# Patient Record
Sex: Female | Born: 1940 | Race: White | Hispanic: No | Marital: Married | Smoking: Never smoker
Health system: Southern US, Community
[De-identification: ages and names within clinical notes are randomized; demographics above are authoritative.]

## PROBLEM LIST (undated history)

## (undated) DIAGNOSIS — B741 Filariasis due to Brugia malayi: Secondary | ICD-10-CM

## (undated) DIAGNOSIS — I10 Essential (primary) hypertension: Secondary | ICD-10-CM

## (undated) DIAGNOSIS — C801 Malignant (primary) neoplasm, unspecified: Secondary | ICD-10-CM

## (undated) DIAGNOSIS — E785 Hyperlipidemia, unspecified: Secondary | ICD-10-CM

## (undated) DIAGNOSIS — K219 Gastro-esophageal reflux disease without esophagitis: Secondary | ICD-10-CM

## (undated) DIAGNOSIS — M1990 Unspecified osteoarthritis, unspecified site: Secondary | ICD-10-CM

## (undated) HISTORY — DX: Gastro-esophageal reflux disease without esophagitis: K21.9

## (undated) HISTORY — DX: Malignant (primary) neoplasm, unspecified: C80.1

## (undated) HISTORY — DX: Essential (primary) hypertension: I10

## (undated) HISTORY — DX: Hyperlipidemia, unspecified: E78.5

## (undated) HISTORY — PX: CHOLECYSTECTOMY: SHX55

## (undated) HISTORY — DX: Filariasis due to Brugia malayi: B74.1

## (undated) HISTORY — DX: Unspecified osteoarthritis, unspecified site: M19.90

---

## 1988-08-14 HISTORY — PX: ABDOMINAL HYSTERECTOMY: SHX81

## 1998-08-14 HISTORY — PX: OTHER SURGICAL HISTORY: SHX169

## 1999-08-17 ENCOUNTER — Inpatient Hospital Stay (HOSPITAL_COMMUNITY): Admission: RE | Admit: 1999-08-17 | Discharge: 1999-08-19 | Payer: Self-pay | Admitting: Obstetrics and Gynecology

## 2000-05-08 ENCOUNTER — Encounter: Admission: RE | Admit: 2000-05-08 | Discharge: 2000-05-08 | Payer: Self-pay | Admitting: Obstetrics and Gynecology

## 2000-05-08 ENCOUNTER — Encounter: Payer: Self-pay | Admitting: Obstetrics and Gynecology

## 2000-05-15 ENCOUNTER — Encounter: Admission: RE | Admit: 2000-05-15 | Discharge: 2000-05-15 | Payer: Self-pay | Admitting: Obstetrics and Gynecology

## 2000-05-15 ENCOUNTER — Encounter: Payer: Self-pay | Admitting: Obstetrics and Gynecology

## 2000-09-24 ENCOUNTER — Other Ambulatory Visit: Admission: RE | Admit: 2000-09-24 | Discharge: 2000-09-24 | Payer: Self-pay | Admitting: Obstetrics and Gynecology

## 2000-10-31 ENCOUNTER — Ambulatory Visit (HOSPITAL_COMMUNITY): Admission: RE | Admit: 2000-10-31 | Discharge: 2000-10-31 | Payer: Self-pay | Admitting: Gastroenterology

## 2001-05-21 ENCOUNTER — Encounter: Payer: Self-pay | Admitting: Obstetrics and Gynecology

## 2001-05-21 ENCOUNTER — Encounter: Admission: RE | Admit: 2001-05-21 | Discharge: 2001-05-21 | Payer: Self-pay | Admitting: Obstetrics and Gynecology

## 2001-11-15 ENCOUNTER — Other Ambulatory Visit: Admission: RE | Admit: 2001-11-15 | Discharge: 2001-11-15 | Payer: Self-pay | Admitting: Obstetrics and Gynecology

## 2002-02-03 ENCOUNTER — Encounter: Payer: Self-pay | Admitting: Family Medicine

## 2002-02-03 ENCOUNTER — Encounter: Admission: RE | Admit: 2002-02-03 | Discharge: 2002-02-03 | Payer: Self-pay | Admitting: Family Medicine

## 2002-06-26 ENCOUNTER — Encounter: Payer: Self-pay | Admitting: Obstetrics and Gynecology

## 2002-06-26 ENCOUNTER — Encounter: Admission: RE | Admit: 2002-06-26 | Discharge: 2002-06-26 | Payer: Self-pay | Admitting: Obstetrics and Gynecology

## 2002-07-22 ENCOUNTER — Ambulatory Visit (HOSPITAL_COMMUNITY): Admission: RE | Admit: 2002-07-22 | Discharge: 2002-07-22 | Payer: Self-pay | Admitting: Gastroenterology

## 2002-11-27 ENCOUNTER — Other Ambulatory Visit: Admission: RE | Admit: 2002-11-27 | Discharge: 2002-11-27 | Payer: Self-pay | Admitting: Obstetrics and Gynecology

## 2003-09-09 ENCOUNTER — Encounter: Admission: RE | Admit: 2003-09-09 | Discharge: 2003-09-09 | Payer: Self-pay | Admitting: Obstetrics and Gynecology

## 2004-12-16 ENCOUNTER — Encounter: Admission: RE | Admit: 2004-12-16 | Discharge: 2004-12-16 | Payer: Self-pay | Admitting: Obstetrics and Gynecology

## 2005-12-19 ENCOUNTER — Encounter: Admission: RE | Admit: 2005-12-19 | Discharge: 2005-12-19 | Payer: Self-pay | Admitting: Obstetrics and Gynecology

## 2006-03-21 ENCOUNTER — Encounter: Admission: RE | Admit: 2006-03-21 | Discharge: 2006-03-21 | Payer: Self-pay | Admitting: Gastroenterology

## 2007-08-15 DIAGNOSIS — C801 Malignant (primary) neoplasm, unspecified: Secondary | ICD-10-CM

## 2007-08-15 HISTORY — DX: Malignant (primary) neoplasm, unspecified: C80.1

## 2007-08-15 HISTORY — PX: BREAST SURGERY: SHX581

## 2007-08-15 HISTORY — PX: OTHER SURGICAL HISTORY: SHX169

## 2008-01-30 ENCOUNTER — Encounter: Admission: RE | Admit: 2008-01-30 | Discharge: 2008-01-30 | Payer: Self-pay | Admitting: Obstetrics and Gynecology

## 2008-02-07 ENCOUNTER — Encounter: Admission: RE | Admit: 2008-02-07 | Discharge: 2008-02-07 | Payer: Self-pay | Admitting: Obstetrics and Gynecology

## 2008-02-18 ENCOUNTER — Encounter: Admission: RE | Admit: 2008-02-18 | Discharge: 2008-02-18 | Payer: Self-pay | Admitting: Obstetrics and Gynecology

## 2008-03-17 ENCOUNTER — Encounter: Admission: RE | Admit: 2008-03-17 | Discharge: 2008-03-17 | Payer: Self-pay | Admitting: General Surgery

## 2008-03-19 ENCOUNTER — Encounter: Admission: RE | Admit: 2008-03-19 | Discharge: 2008-03-19 | Payer: Self-pay | Admitting: General Surgery

## 2008-03-20 ENCOUNTER — Ambulatory Visit (HOSPITAL_BASED_OUTPATIENT_CLINIC_OR_DEPARTMENT_OTHER): Admission: RE | Admit: 2008-03-20 | Discharge: 2008-03-20 | Payer: Self-pay | Admitting: General Surgery

## 2008-03-20 ENCOUNTER — Encounter: Admission: RE | Admit: 2008-03-20 | Discharge: 2008-03-20 | Payer: Self-pay | Admitting: General Surgery

## 2008-03-20 ENCOUNTER — Encounter (INDEPENDENT_AMBULATORY_CARE_PROVIDER_SITE_OTHER): Payer: Self-pay | Admitting: General Surgery

## 2008-03-24 ENCOUNTER — Ambulatory Visit: Payer: Self-pay | Admitting: Oncology

## 2008-04-06 ENCOUNTER — Ambulatory Visit: Admission: RE | Admit: 2008-04-06 | Discharge: 2008-04-28 | Payer: Self-pay | Admitting: Radiation Oncology

## 2008-04-15 LAB — CBC WITH DIFFERENTIAL/PLATELET
Basophils Absolute: 0 10*3/uL (ref 0.0–0.1)
EOS%: 1.8 % (ref 0.0–7.0)
Eosinophils Absolute: 0.1 10*3/uL (ref 0.0–0.5)
HCT: 39.8 % (ref 34.8–46.6)
HGB: 13.8 g/dL (ref 11.6–15.9)
LYMPH%: 28.2 % (ref 14.0–48.0)
MCH: 31.9 pg (ref 26.0–34.0)
MCV: 92 fL (ref 81.0–101.0)
MONO%: 11.2 % (ref 0.0–13.0)
NEUT#: 3.5 10*3/uL (ref 1.5–6.5)
NEUT%: 58.4 % (ref 39.6–76.8)
Platelets: 253 10*3/uL (ref 145–400)

## 2008-04-16 LAB — VITAMIN D 25 HYDROXY (VIT D DEFICIENCY, FRACTURES): Vit D, 25-Hydroxy: 28 ng/mL — ABNORMAL LOW (ref 30–89)

## 2008-04-16 LAB — COMPREHENSIVE METABOLIC PANEL
ALT: 21 U/L (ref 0–35)
AST: 22 U/L (ref 0–37)
Alkaline Phosphatase: 73 U/L (ref 39–117)
BUN: 13 mg/dL (ref 6–23)
Creatinine, Ser: 0.73 mg/dL (ref 0.40–1.20)
Total Bilirubin: 0.5 mg/dL (ref 0.3–1.2)

## 2008-04-21 ENCOUNTER — Ambulatory Visit (HOSPITAL_COMMUNITY): Admission: RE | Admit: 2008-04-21 | Discharge: 2008-04-21 | Payer: Self-pay | Admitting: Oncology

## 2008-04-22 ENCOUNTER — Ambulatory Visit: Payer: Self-pay | Admitting: Cardiovascular Disease

## 2008-04-22 ENCOUNTER — Ambulatory Visit: Admission: RE | Admit: 2008-04-22 | Discharge: 2008-04-22 | Payer: Self-pay | Admitting: Oncology

## 2008-04-22 ENCOUNTER — Encounter: Payer: Self-pay | Admitting: Oncology

## 2008-04-24 ENCOUNTER — Ambulatory Visit (HOSPITAL_BASED_OUTPATIENT_CLINIC_OR_DEPARTMENT_OTHER): Admission: RE | Admit: 2008-04-24 | Discharge: 2008-04-24 | Payer: Self-pay | Admitting: General Surgery

## 2008-05-07 LAB — CBC WITH DIFFERENTIAL/PLATELET
BASO%: 0.5 % (ref 0.0–2.0)
Basophils Absolute: 0 10*3/uL (ref 0.0–0.1)
Eosinophils Absolute: 0.1 10*3/uL (ref 0.0–0.5)
HCT: 42.1 % (ref 34.8–46.6)
HGB: 14.5 g/dL (ref 11.6–15.9)
MCHC: 34.5 g/dL (ref 32.0–36.0)
MONO#: 0.7 10*3/uL (ref 0.1–0.9)
NEUT#: 0.9 10*3/uL — ABNORMAL LOW (ref 1.5–6.5)
NEUT%: 32.6 % — ABNORMAL LOW (ref 39.6–76.8)
WBC: 2.7 10*3/uL — ABNORMAL LOW (ref 3.9–10.0)
lymph#: 1.1 10*3/uL (ref 0.9–3.3)

## 2008-05-13 ENCOUNTER — Ambulatory Visit: Payer: Self-pay | Admitting: Oncology

## 2008-05-15 LAB — CBC WITH DIFFERENTIAL/PLATELET
BASO%: 1.5 % (ref 0.0–2.0)
HCT: 37.2 % (ref 34.8–46.6)
LYMPH%: 18.1 % (ref 14.0–48.0)
MCH: 30.7 pg (ref 26.0–34.0)
MCHC: 34.5 g/dL (ref 32.0–36.0)
MCV: 89.1 fL (ref 81.0–101.0)
MONO#: 0.7 10*3/uL (ref 0.1–0.9)
MONO%: 9.8 % (ref 0.0–13.0)
NEUT%: 70.5 % (ref 39.6–76.8)
Platelets: 165 10*3/uL (ref 145–400)
RBC: 4.17 10*6/uL (ref 3.70–5.32)
WBC: 7.4 10*3/uL (ref 3.9–10.0)

## 2008-05-22 LAB — CBC WITH DIFFERENTIAL/PLATELET
BASO%: 0.3 % (ref 0.0–2.0)
HCT: 32.9 % — ABNORMAL LOW (ref 34.8–46.6)
MCHC: 35.7 g/dL (ref 32.0–36.0)
MONO#: 0.6 10*3/uL (ref 0.1–0.9)
NEUT%: 86.5 % — ABNORMAL HIGH (ref 39.6–76.8)
RDW: 13 % (ref 11.3–14.5)
WBC: 13.3 10*3/uL — ABNORMAL HIGH (ref 3.9–10.0)
lymph#: 1.2 10*3/uL (ref 0.9–3.3)

## 2008-05-22 LAB — COMPREHENSIVE METABOLIC PANEL
ALT: 53 U/L — ABNORMAL HIGH (ref 0–35)
Alkaline Phosphatase: 69 U/L (ref 39–117)
CO2: 23 mEq/L (ref 19–32)
Creatinine, Ser: 0.62 mg/dL (ref 0.40–1.20)
Sodium: 137 mEq/L (ref 135–145)
Total Bilirubin: 0.4 mg/dL (ref 0.3–1.2)
Total Protein: 6.6 g/dL (ref 6.0–8.3)

## 2008-05-29 LAB — CBC WITH DIFFERENTIAL/PLATELET
BASO%: 1.9 % (ref 0.0–2.0)
Basophils Absolute: 0.1 10e3/uL (ref 0.0–0.1)
EOS%: 2.2 % (ref 0.0–7.0)
Eosinophils Absolute: 0.2 10e3/uL (ref 0.0–0.5)
HCT: 35.4 % (ref 34.8–46.6)
HGB: 12.4 g/dL (ref 11.6–15.9)
LYMPH%: 21.7 % (ref 14.0–48.0)
MCH: 30.6 pg (ref 26.0–34.0)
MCHC: 34.9 g/dL (ref 32.0–36.0)
MCV: 87.7 fL (ref 81.0–101.0)
MONO#: 1.5 10e3/uL — ABNORMAL HIGH (ref 0.1–0.9)
MONO%: 21.5 % — ABNORMAL HIGH (ref 0.0–13.0)
NEUT#: 3.7 10e3/uL (ref 1.5–6.5)
NEUT%: 52.7 % (ref 39.6–76.8)
Platelets: 161 10e3/uL (ref 145–400)
RBC: 4.04 10e6/uL (ref 3.70–5.32)
RDW: 12.8 % (ref 11.3–14.5)
WBC: 7 10e3/uL (ref 3.9–10.0)
lymph#: 1.5 10e3/uL (ref 0.9–3.3)

## 2008-06-05 LAB — CBC WITH DIFFERENTIAL/PLATELET
Basophils Absolute: 0.1 10*3/uL (ref 0.0–0.1)
EOS%: 0.4 % (ref 0.0–7.0)
Eosinophils Absolute: 0 10*3/uL (ref 0.0–0.5)
HCT: 32 % — ABNORMAL LOW (ref 34.8–46.6)
HGB: 11.2 g/dL — ABNORMAL LOW (ref 11.6–15.9)
MCH: 31.4 pg (ref 26.0–34.0)
MCV: 89.3 fL (ref 81.0–101.0)
MONO%: 8.4 % (ref 0.0–13.0)
NEUT#: 4.9 10*3/uL (ref 1.5–6.5)
NEUT%: 73.9 % (ref 39.6–76.8)
Platelets: 150 10*3/uL (ref 145–400)

## 2008-06-12 LAB — COMPREHENSIVE METABOLIC PANEL
AST: 25 U/L (ref 0–37)
Alkaline Phosphatase: 79 U/L (ref 39–117)
BUN: 15 mg/dL (ref 6–23)
Glucose, Bld: 162 mg/dL — ABNORMAL HIGH (ref 70–99)
Potassium: 3.9 mEq/L (ref 3.5–5.3)
Total Bilirubin: 0.5 mg/dL (ref 0.3–1.2)

## 2008-06-12 LAB — CBC WITH DIFFERENTIAL/PLATELET
Basophils Absolute: 0 10*3/uL (ref 0.0–0.1)
EOS%: 0.2 % (ref 0.0–7.0)
Eosinophils Absolute: 0 10*3/uL (ref 0.0–0.5)
HGB: 10.7 g/dL — ABNORMAL LOW (ref 11.6–15.9)
LYMPH%: 10.3 % — ABNORMAL LOW (ref 14.0–48.0)
MCH: 32 pg (ref 26.0–34.0)
MCV: 90.9 fL (ref 81.0–101.0)
MONO%: 4.5 % (ref 0.0–13.0)
NEUT#: 8.8 10*3/uL — ABNORMAL HIGH (ref 1.5–6.5)
Platelets: 256 10*3/uL (ref 145–400)
RBC: 3.36 10*6/uL — ABNORMAL LOW (ref 3.70–5.32)
RDW: 15.6 % — ABNORMAL HIGH (ref 11.3–14.5)

## 2008-06-18 LAB — CBC WITH DIFFERENTIAL/PLATELET
Basophils Absolute: 0 10*3/uL (ref 0.0–0.1)
Eosinophils Absolute: 0.1 10*3/uL (ref 0.0–0.5)
LYMPH%: 40.4 % (ref 14.0–48.0)
MCV: 91.1 fL (ref 81.0–101.0)
MONO%: 17 % — ABNORMAL HIGH (ref 0.0–13.0)
NEUT#: 1.2 10*3/uL — ABNORMAL LOW (ref 1.5–6.5)
Platelets: 182 10*3/uL (ref 145–400)
RBC: 3.54 10*6/uL — ABNORMAL LOW (ref 3.70–5.32)

## 2008-06-26 LAB — CBC WITH DIFFERENTIAL/PLATELET
BASO%: 1 % (ref 0.0–2.0)
LYMPH%: 21.6 % (ref 14.0–48.0)
MCH: 31.8 pg (ref 26.0–34.0)
MCHC: 34.1 g/dL (ref 32.0–36.0)
MCV: 93.4 fL (ref 81.0–101.0)
MONO%: 10.7 % (ref 0.0–13.0)
Platelets: 144 10*3/uL — ABNORMAL LOW (ref 145–400)
RBC: 3.28 10*6/uL — ABNORMAL LOW (ref 3.70–5.32)
WBC: 5.7 10*3/uL (ref 3.9–10.0)

## 2008-07-01 ENCOUNTER — Ambulatory Visit: Payer: Self-pay | Admitting: Oncology

## 2008-07-03 LAB — COMPREHENSIVE METABOLIC PANEL
Albumin: 4 g/dL (ref 3.5–5.2)
Alkaline Phosphatase: 72 U/L (ref 39–117)
BUN: 23 mg/dL (ref 6–23)
CO2: 22 mEq/L (ref 19–32)
Glucose, Bld: 212 mg/dL — ABNORMAL HIGH (ref 70–99)
Total Bilirubin: 0.4 mg/dL (ref 0.3–1.2)

## 2008-07-03 LAB — CBC WITH DIFFERENTIAL/PLATELET
Basophils Absolute: 0 10*3/uL (ref 0.0–0.1)
Eosinophils Absolute: 0 10*3/uL (ref 0.0–0.5)
HGB: 10.3 g/dL — ABNORMAL LOW (ref 11.6–15.9)
MCV: 95.9 fL (ref 81.0–101.0)
MONO#: 0.4 10*3/uL (ref 0.1–0.9)
MONO%: 3.6 % (ref 0.0–13.0)
NEUT#: 9 10*3/uL — ABNORMAL HIGH (ref 1.5–6.5)
RBC: 3.15 10*6/uL — ABNORMAL LOW (ref 3.70–5.32)
RDW: 17 % — ABNORMAL HIGH (ref 11.3–14.5)
WBC: 10.3 10*3/uL — ABNORMAL HIGH (ref 3.9–10.0)

## 2008-07-10 LAB — CBC WITH DIFFERENTIAL/PLATELET
Basophils Absolute: 0.1 10*3/uL (ref 0.0–0.1)
Eosinophils Absolute: 0.1 10*3/uL (ref 0.0–0.5)
HCT: 33.6 % — ABNORMAL LOW (ref 34.8–46.6)
HGB: 12.1 g/dL (ref 11.6–15.9)
LYMPH%: 26.8 % (ref 14.0–48.0)
MCV: 94 fL (ref 81.0–101.0)
MONO#: 0.9 10*3/uL (ref 0.1–0.9)
MONO%: 23.6 % — ABNORMAL HIGH (ref 0.0–13.0)
NEUT#: 1.7 10*3/uL (ref 1.5–6.5)
NEUT%: 46.5 % (ref 39.6–76.8)
Platelets: 194 10*3/uL (ref 145–400)
RBC: 3.57 10*6/uL — ABNORMAL LOW (ref 3.70–5.32)
WBC: 3.6 10*3/uL — ABNORMAL LOW (ref 3.9–10.0)

## 2008-07-17 LAB — CBC WITH DIFFERENTIAL/PLATELET
Basophils Absolute: 0.1 10*3/uL (ref 0.0–0.1)
Eosinophils Absolute: 0 10*3/uL (ref 0.0–0.5)
HGB: 10.4 g/dL — ABNORMAL LOW (ref 11.6–15.9)
MONO#: 0.7 10*3/uL (ref 0.1–0.9)
MONO%: 9.4 % (ref 0.0–13.0)
NEUT#: 5.3 10*3/uL (ref 1.5–6.5)
RBC: 3.11 10*6/uL — ABNORMAL LOW (ref 3.70–5.32)
RDW: 15.8 % — ABNORMAL HIGH (ref 11.3–14.5)
WBC: 7.4 10*3/uL (ref 3.9–10.0)
lymph#: 1.3 10*3/uL (ref 0.9–3.3)

## 2008-07-24 LAB — CBC WITH DIFFERENTIAL/PLATELET
BASO%: 0.1 % (ref 0.0–2.0)
Basophils Absolute: 0 10*3/uL (ref 0.0–0.1)
EOS%: 0 % (ref 0.0–7.0)
Eosinophils Absolute: 0 10*3/uL (ref 0.0–0.5)
HCT: 27.5 % — ABNORMAL LOW (ref 34.8–46.6)
HGB: 9.8 g/dL — ABNORMAL LOW (ref 11.6–15.9)
MCV: 97.3 fL (ref 81.0–101.0)
MONO#: 0.3 10*3/uL (ref 0.1–0.9)
NEUT#: 8 10*3/uL — ABNORMAL HIGH (ref 1.5–6.5)
NEUT%: 89.5 % — ABNORMAL HIGH (ref 39.6–76.8)
Platelets: 171 10*3/uL (ref 145–400)
RBC: 2.82 10*6/uL — ABNORMAL LOW (ref 3.70–5.32)
WBC: 8.9 10*3/uL (ref 3.9–10.0)

## 2008-07-24 LAB — COMPREHENSIVE METABOLIC PANEL
ALT: 26 U/L (ref 0–35)
AST: 23 U/L (ref 0–37)
Albumin: 3.9 g/dL (ref 3.5–5.2)
BUN: 22 mg/dL (ref 6–23)
CO2: 22 mEq/L (ref 19–32)
Calcium: 8.2 mg/dL — ABNORMAL LOW (ref 8.4–10.5)
Chloride: 105 mEq/L (ref 96–112)
Potassium: 3.8 mEq/L (ref 3.5–5.3)

## 2008-07-31 LAB — CBC WITH DIFFERENTIAL/PLATELET
BASO%: 1.3 % (ref 0.0–2.0)
EOS%: 0.4 % (ref 0.0–7.0)
HGB: 10.7 g/dL — ABNORMAL LOW (ref 11.6–15.9)
MCH: 34.6 pg — ABNORMAL HIGH (ref 26.0–34.0)
MCHC: 35.3 g/dL (ref 32.0–36.0)
RDW: 14.6 % — ABNORMAL HIGH (ref 11.3–14.5)
WBC: 5.4 10*3/uL (ref 3.9–10.0)
lymph#: 1.4 10*3/uL (ref 0.9–3.3)

## 2008-08-05 LAB — CBC WITH DIFFERENTIAL/PLATELET
Basophils Absolute: 0.1 10*3/uL (ref 0.0–0.1)
EOS%: 0.2 % (ref 0.0–7.0)
Eosinophils Absolute: 0 10*3/uL (ref 0.0–0.5)
HGB: 10.2 g/dL — ABNORMAL LOW (ref 11.6–15.9)
MONO#: 0.9 10*3/uL (ref 0.1–0.9)
NEUT#: 6.6 10*3/uL — ABNORMAL HIGH (ref 1.5–6.5)
RDW: 15.3 % — ABNORMAL HIGH (ref 11.3–14.5)
WBC: 8.7 10*3/uL (ref 3.9–10.0)
lymph#: 1.2 10*3/uL (ref 0.9–3.3)

## 2008-08-11 ENCOUNTER — Ambulatory Visit: Payer: Self-pay | Admitting: Oncology

## 2008-08-13 LAB — CBC WITH DIFFERENTIAL/PLATELET
Basophils Absolute: 0 10*3/uL (ref 0.0–0.1)
Eosinophils Absolute: 0 10*3/uL (ref 0.0–0.5)
HGB: 9.9 g/dL — ABNORMAL LOW (ref 11.6–15.9)
MCV: 103.3 fL — ABNORMAL HIGH (ref 81.0–101.0)
MONO%: 16.1 % — ABNORMAL HIGH (ref 0.0–13.0)
NEUT#: 3.5 10*3/uL (ref 1.5–6.5)
Platelets: 173 10*3/uL (ref 145–400)
RDW: 17.5 % — ABNORMAL HIGH (ref 11.3–14.5)

## 2008-08-21 LAB — CBC WITH DIFFERENTIAL/PLATELET
Basophils Absolute: 0 10*3/uL (ref 0.0–0.1)
EOS%: 0.1 % (ref 0.0–7.0)
HGB: 9.5 g/dL — ABNORMAL LOW (ref 11.6–15.9)
MCH: 35.8 pg — ABNORMAL HIGH (ref 26.0–34.0)
MCV: 103.2 fL — ABNORMAL HIGH (ref 81.0–101.0)
MONO%: 16.3 % — ABNORMAL HIGH (ref 0.0–13.0)
NEUT#: 4 10*3/uL (ref 1.5–6.5)
RBC: 2.65 10*6/uL — ABNORMAL LOW (ref 3.70–5.32)
RDW: 15.8 % — ABNORMAL HIGH (ref 11.3–14.5)
lymph#: 0.9 10*3/uL (ref 0.9–3.3)

## 2008-08-21 LAB — COMPREHENSIVE METABOLIC PANEL
ALT: 22 U/L (ref 0–35)
AST: 27 U/L (ref 0–37)
Albumin: 3.7 g/dL (ref 3.5–5.2)
Alkaline Phosphatase: 74 U/L (ref 39–117)
Calcium: 7.4 mg/dL — ABNORMAL LOW (ref 8.4–10.5)
Chloride: 102 mEq/L (ref 96–112)
Potassium: 3.1 mEq/L — ABNORMAL LOW (ref 3.5–5.3)
Sodium: 139 mEq/L (ref 135–145)
Total Protein: 5.8 g/dL — ABNORMAL LOW (ref 6.0–8.3)

## 2008-08-24 ENCOUNTER — Ambulatory Visit: Admission: RE | Admit: 2008-08-24 | Discharge: 2008-11-10 | Payer: Self-pay | Admitting: Radiation Oncology

## 2008-08-28 LAB — CBC WITH DIFFERENTIAL/PLATELET
BASO%: 0.3 % (ref 0.0–2.0)
Basophils Absolute: 0 10*3/uL (ref 0.0–0.1)
EOS%: 0.1 % (ref 0.0–7.0)
MCH: 36.2 pg — ABNORMAL HIGH (ref 26.0–34.0)
MCHC: 34.6 g/dL (ref 32.0–36.0)
MCV: 104.6 fL — ABNORMAL HIGH (ref 81.0–101.0)
MONO%: 9.1 % (ref 0.0–13.0)
RBC: 2.62 10*6/uL — ABNORMAL LOW (ref 3.70–5.32)
RDW: 17.2 % — ABNORMAL HIGH (ref 11.3–14.5)
lymph#: 1.1 10*3/uL (ref 0.9–3.3)

## 2008-08-28 LAB — BASIC METABOLIC PANEL
BUN: 8 mg/dL (ref 6–23)
Potassium: 3.8 mEq/L (ref 3.5–5.3)
Sodium: 136 mEq/L (ref 135–145)

## 2008-09-01 ENCOUNTER — Ambulatory Visit: Admission: EM | Admit: 2008-09-01 | Discharge: 2008-09-01 | Payer: Self-pay | Admitting: Oncology

## 2008-09-01 ENCOUNTER — Encounter: Payer: Self-pay | Admitting: Oncology

## 2008-09-04 LAB — COMPREHENSIVE METABOLIC PANEL
AST: 21 U/L (ref 0–37)
Albumin: 3.3 g/dL — ABNORMAL LOW (ref 3.5–5.2)
Alkaline Phosphatase: 50 U/L (ref 39–117)
BUN: 15 mg/dL (ref 6–23)
Calcium: 7.6 mg/dL — ABNORMAL LOW (ref 8.4–10.5)
Chloride: 100 mEq/L (ref 96–112)
Glucose, Bld: 102 mg/dL — ABNORMAL HIGH (ref 70–99)
Potassium: 3.7 mEq/L (ref 3.5–5.3)
Sodium: 137 mEq/L (ref 135–145)
Total Protein: 5.4 g/dL — ABNORMAL LOW (ref 6.0–8.3)

## 2008-09-04 LAB — CBC WITH DIFFERENTIAL/PLATELET
Basophils Absolute: 0 10*3/uL (ref 0.0–0.1)
Eosinophils Absolute: 0 10*3/uL (ref 0.0–0.5)
HGB: 9.3 g/dL — ABNORMAL LOW (ref 11.6–15.9)
NEUT#: 4.1 10*3/uL (ref 1.5–6.5)
RBC: 2.56 10*6/uL — ABNORMAL LOW (ref 3.70–5.32)
RDW: 17.7 % — ABNORMAL HIGH (ref 11.3–14.5)
WBC: 6.3 10*3/uL (ref 3.9–10.0)
lymph#: 1.2 10*3/uL (ref 0.9–3.3)

## 2008-09-11 LAB — CBC WITH DIFFERENTIAL/PLATELET
BASO%: 0.2 % (ref 0.0–2.0)
Basophils Absolute: 0 10*3/uL (ref 0.0–0.1)
EOS%: 0.5 % (ref 0.0–7.0)
HGB: 9.6 g/dL — ABNORMAL LOW (ref 11.6–15.9)
MCH: 37.1 pg — ABNORMAL HIGH (ref 26.0–34.0)
MCV: 108.1 fL — ABNORMAL HIGH (ref 81.0–101.0)
MONO%: 14.5 % — ABNORMAL HIGH (ref 0.0–13.0)
RBC: 2.6 10*6/uL — ABNORMAL LOW (ref 3.70–5.32)
RDW: 18 % — ABNORMAL HIGH (ref 11.3–14.5)
lymph#: 0.8 10*3/uL — ABNORMAL LOW (ref 0.9–3.3)

## 2008-09-23 ENCOUNTER — Ambulatory Visit: Payer: Self-pay | Admitting: Oncology

## 2008-09-24 ENCOUNTER — Ambulatory Visit: Payer: Self-pay | Admitting: Vascular Surgery

## 2008-09-24 ENCOUNTER — Ambulatory Visit: Admission: RE | Admit: 2008-09-24 | Discharge: 2008-09-24 | Payer: Self-pay | Admitting: Orthopedic Surgery

## 2008-09-24 ENCOUNTER — Encounter (INDEPENDENT_AMBULATORY_CARE_PROVIDER_SITE_OTHER): Payer: Self-pay | Admitting: Orthopedic Surgery

## 2008-09-25 LAB — CBC WITH DIFFERENTIAL/PLATELET
Basophils Absolute: 0 10*3/uL (ref 0.0–0.1)
EOS%: 2.1 % (ref 0.0–7.0)
Eosinophils Absolute: 0.1 10*3/uL (ref 0.0–0.5)
HGB: 10.4 g/dL — ABNORMAL LOW (ref 11.6–15.9)
MONO#: 0.6 10*3/uL (ref 0.1–0.9)
NEUT#: 3 10*3/uL (ref 1.5–6.5)
RDW: 15.1 % — ABNORMAL HIGH (ref 11.3–14.5)
WBC: 4.3 10*3/uL (ref 3.9–10.0)
lymph#: 0.6 10*3/uL — ABNORMAL LOW (ref 0.9–3.3)

## 2008-10-02 LAB — CBC WITH DIFFERENTIAL/PLATELET
Basophils Absolute: 0 10*3/uL (ref 0.0–0.1)
EOS%: 2.3 % (ref 0.0–7.0)
HGB: 10.7 g/dL — ABNORMAL LOW (ref 11.6–15.9)
MCV: 104.5 fL — ABNORMAL HIGH (ref 79.5–101.0)
NEUT#: 4.1 10*3/uL (ref 1.5–6.5)
NEUT%: 72.7 % (ref 38.4–76.8)
Platelets: 225 10*3/uL (ref 145–400)
lymph#: 0.6 10*3/uL — ABNORMAL LOW (ref 0.9–3.3)

## 2008-10-02 LAB — COMPREHENSIVE METABOLIC PANEL
ALT: 15 U/L (ref 0–35)
AST: 18 U/L (ref 0–37)
CO2: 24 mEq/L (ref 19–32)
Calcium: 8.8 mg/dL (ref 8.4–10.5)
Chloride: 104 mEq/L (ref 96–112)
Potassium: 3.8 mEq/L (ref 3.5–5.3)
Sodium: 139 mEq/L (ref 135–145)
Total Protein: 6.3 g/dL (ref 6.0–8.3)

## 2008-10-09 LAB — CBC WITH DIFFERENTIAL/PLATELET
BASO%: 0.4 % (ref 0.0–2.0)
EOS%: 3.7 % (ref 0.0–7.0)
HCT: 32.2 % — ABNORMAL LOW (ref 34.8–46.6)
LYMPH%: 13 % — ABNORMAL LOW (ref 14.0–49.7)
MCH: 33.8 pg (ref 25.1–34.0)
MCHC: 32.9 g/dL (ref 31.5–36.0)
MONO#: 0.7 10*3/uL (ref 0.1–0.9)
NEUT%: 68.3 % (ref 38.4–76.8)
RBC: 3.14 10*6/uL — ABNORMAL LOW (ref 3.70–5.45)
WBC: 4.9 10*3/uL (ref 3.9–10.3)
lymph#: 0.6 10*3/uL — ABNORMAL LOW (ref 0.9–3.3)
nRBC: 0 % (ref 0–0)

## 2008-10-16 LAB — COMPREHENSIVE METABOLIC PANEL
Albumin: 3.6 g/dL (ref 3.5–5.2)
BUN: 20 mg/dL (ref 6–23)
CO2: 22 mEq/L (ref 19–32)
Calcium: 8.6 mg/dL (ref 8.4–10.5)
Chloride: 104 mEq/L (ref 96–112)
Creatinine, Ser: 0.59 mg/dL (ref 0.40–1.20)
Glucose, Bld: 101 mg/dL — ABNORMAL HIGH (ref 70–99)
Potassium: 3.9 mEq/L (ref 3.5–5.3)

## 2008-10-16 LAB — CBC WITH DIFFERENTIAL/PLATELET
BASO%: 0.2 % (ref 0.0–2.0)
Basophils Absolute: 0 10*3/uL (ref 0.0–0.1)
HCT: 33.4 % — ABNORMAL LOW (ref 34.8–46.6)
HGB: 11 g/dL — ABNORMAL LOW (ref 11.6–15.9)
LYMPH%: 15.5 % (ref 14.0–49.7)
MCHC: 32.9 g/dL (ref 31.5–36.0)
MONO#: 0.6 10*3/uL (ref 0.1–0.9)
NEUT%: 64.6 % (ref 38.4–76.8)
Platelets: 198 10*3/uL (ref 145–400)
WBC: 4.3 10*3/uL (ref 3.9–10.3)

## 2008-10-23 LAB — CBC WITH DIFFERENTIAL/PLATELET
Basophils Absolute: 0 10*3/uL (ref 0.0–0.1)
Eosinophils Absolute: 0.1 10*3/uL (ref 0.0–0.5)
HCT: 29.1 % — ABNORMAL LOW (ref 34.8–46.6)
HGB: 9.6 g/dL — ABNORMAL LOW (ref 11.6–15.9)
LYMPH%: 13.5 % — ABNORMAL LOW (ref 14.0–49.7)
MCV: 99.7 fL (ref 79.5–101.0)
MONO#: 0.9 10*3/uL (ref 0.1–0.9)
NEUT#: 3.8 10*3/uL (ref 1.5–6.5)
Platelets: 183 10*3/uL (ref 145–400)
RBC: 2.92 10*6/uL — ABNORMAL LOW (ref 3.70–5.45)
WBC: 5.4 10*3/uL (ref 3.9–10.3)
nRBC: 0 % (ref 0–0)

## 2008-10-29 LAB — COMPREHENSIVE METABOLIC PANEL
Albumin: 3.8 g/dL (ref 3.5–5.2)
BUN: 22 mg/dL (ref 6–23)
Calcium: 8.6 mg/dL (ref 8.4–10.5)
Chloride: 102 mEq/L (ref 96–112)
Glucose, Bld: 102 mg/dL — ABNORMAL HIGH (ref 70–99)
Potassium: 3.8 mEq/L (ref 3.5–5.3)

## 2008-10-29 LAB — CBC WITH DIFFERENTIAL/PLATELET
Basophils Absolute: 0 10*3/uL (ref 0.0–0.1)
EOS%: 4.3 % (ref 0.0–7.0)
HCT: 35.3 % (ref 34.8–46.6)
HGB: 11.9 g/dL (ref 11.6–15.9)
LYMPH%: 8.9 % — ABNORMAL LOW (ref 14.0–49.7)
MCH: 33.3 pg (ref 25.1–34.0)
MONO#: 1.1 10*3/uL — ABNORMAL HIGH (ref 0.1–0.9)
NEUT%: 70.2 % (ref 38.4–76.8)
Platelets: 217 10*3/uL (ref 145–400)
lymph#: 0.6 10*3/uL — ABNORMAL LOW (ref 0.9–3.3)

## 2008-11-17 ENCOUNTER — Ambulatory Visit: Payer: Self-pay | Admitting: Oncology

## 2008-11-19 LAB — CBC WITH DIFFERENTIAL/PLATELET
Basophils Absolute: 0 10*3/uL (ref 0.0–0.1)
EOS%: 1.5 % (ref 0.0–7.0)
HCT: 28.9 % — ABNORMAL LOW (ref 34.8–46.6)
HGB: 9.9 g/dL — ABNORMAL LOW (ref 11.6–15.9)
MCH: 33.6 pg (ref 25.1–34.0)
MONO#: 0.8 10*3/uL (ref 0.1–0.9)
NEUT%: 77.9 % — ABNORMAL HIGH (ref 38.4–76.8)
lymph#: 0.6 10*3/uL — ABNORMAL LOW (ref 0.9–3.3)

## 2008-11-24 ENCOUNTER — Ambulatory Visit: Admission: RE | Admit: 2008-11-24 | Discharge: 2008-11-24 | Payer: Self-pay | Admitting: Oncology

## 2008-11-24 ENCOUNTER — Encounter: Payer: Self-pay | Admitting: Oncology

## 2008-12-01 ENCOUNTER — Ambulatory Visit: Admission: RE | Admit: 2008-12-01 | Discharge: 2008-12-01 | Payer: Self-pay | Admitting: Radiation Oncology

## 2008-12-01 LAB — CBC WITH DIFFERENTIAL/PLATELET
Basophils Absolute: 0 10*3/uL (ref 0.0–0.1)
Eosinophils Absolute: 0.1 10*3/uL (ref 0.0–0.5)
HGB: 10.4 g/dL — ABNORMAL LOW (ref 11.6–15.9)
LYMPH%: 12.5 % — ABNORMAL LOW (ref 14.0–49.7)
MCV: 96 fL (ref 79.5–101.0)
MONO%: 14 % (ref 0.0–14.0)
NEUT#: 3.7 10*3/uL (ref 1.5–6.5)
NEUT%: 71.5 % (ref 38.4–76.8)
Platelets: 266 10*3/uL (ref 145–400)

## 2008-12-09 ENCOUNTER — Encounter: Admission: RE | Admit: 2008-12-09 | Discharge: 2009-02-03 | Payer: Self-pay | Admitting: Radiation Oncology

## 2008-12-10 LAB — CBC WITH DIFFERENTIAL/PLATELET
BASO%: 0.2 % (ref 0.0–2.0)
LYMPH%: 19.6 % (ref 14.0–49.7)
MCHC: 32.7 g/dL (ref 31.5–36.0)
MCV: 96.8 fL (ref 79.5–101.0)
MONO%: 11.4 % (ref 0.0–14.0)
Platelets: 243 10*3/uL (ref 145–400)
RBC: 3.47 10*6/uL — ABNORMAL LOW (ref 3.70–5.45)

## 2008-12-10 LAB — COMPREHENSIVE METABOLIC PANEL
ALT: 17 U/L (ref 0–35)
Alkaline Phosphatase: 69 U/L (ref 39–117)
Creatinine, Ser: 0.62 mg/dL (ref 0.40–1.20)
Glucose, Bld: 105 mg/dL — ABNORMAL HIGH (ref 70–99)
Sodium: 137 mEq/L (ref 135–145)
Total Bilirubin: 0.2 mg/dL — ABNORMAL LOW (ref 0.3–1.2)
Total Protein: 6.2 g/dL (ref 6.0–8.3)

## 2008-12-29 ENCOUNTER — Ambulatory Visit: Payer: Self-pay | Admitting: Oncology

## 2008-12-31 LAB — CBC WITH DIFFERENTIAL/PLATELET
Basophils Absolute: 0 10*3/uL (ref 0.0–0.1)
EOS%: 2.9 % (ref 0.0–7.0)
HCT: 34.3 % — ABNORMAL LOW (ref 34.8–46.6)
HGB: 11.7 g/dL (ref 11.6–15.9)
LYMPH%: 18.8 % (ref 14.0–49.7)
MCH: 32.5 pg (ref 25.1–34.0)
MCV: 95.2 fL (ref 79.5–101.0)
MONO%: 14.2 % — ABNORMAL HIGH (ref 0.0–14.0)
NEUT%: 63.6 % (ref 38.4–76.8)
Platelets: 214 10*3/uL (ref 145–400)

## 2008-12-31 LAB — COMPREHENSIVE METABOLIC PANEL
AST: 23 U/L (ref 0–37)
Alkaline Phosphatase: 72 U/L (ref 39–117)
BUN: 19 mg/dL (ref 6–23)
Calcium: 9 mg/dL (ref 8.4–10.5)
Chloride: 103 mEq/L (ref 96–112)
Creatinine, Ser: 0.62 mg/dL (ref 0.40–1.20)

## 2009-01-21 LAB — COMPREHENSIVE METABOLIC PANEL
AST: 22 U/L (ref 0–37)
Alkaline Phosphatase: 65 U/L (ref 39–117)
BUN: 14 mg/dL (ref 6–23)
Glucose, Bld: 87 mg/dL (ref 70–99)
Sodium: 140 mEq/L (ref 135–145)
Total Bilirubin: 0.4 mg/dL (ref 0.3–1.2)
Total Protein: 6.5 g/dL (ref 6.0–8.3)

## 2009-01-21 LAB — CBC WITH DIFFERENTIAL/PLATELET
Eosinophils Absolute: 0.1 10*3/uL (ref 0.0–0.5)
HCT: 35.4 % (ref 34.8–46.6)
LYMPH%: 24 % (ref 14.0–49.7)
MCV: 94.7 fL (ref 79.5–101.0)
MONO#: 0.5 10*3/uL (ref 0.1–0.9)
NEUT#: 2.7 10*3/uL (ref 1.5–6.5)
NEUT%: 60.8 % (ref 38.4–76.8)
Platelets: 203 10*3/uL (ref 145–400)
WBC: 4.5 10*3/uL (ref 3.9–10.3)

## 2009-02-09 ENCOUNTER — Ambulatory Visit: Payer: Self-pay | Admitting: Oncology

## 2009-02-11 LAB — CBC WITH DIFFERENTIAL/PLATELET
Eosinophils Absolute: 0.2 10*3/uL (ref 0.0–0.5)
HCT: 35.6 % (ref 34.8–46.6)
LYMPH%: 20.1 % (ref 14.0–49.7)
MCHC: 33.4 g/dL (ref 31.5–36.0)
MCV: 93.9 fL (ref 79.5–101.0)
MONO#: 0.6 10*3/uL (ref 0.1–0.9)
MONO%: 13.8 % (ref 0.0–14.0)
NEUT#: 2.7 10*3/uL (ref 1.5–6.5)
NEUT%: 61.8 % (ref 38.4–76.8)
Platelets: 186 10*3/uL (ref 145–400)
RBC: 3.79 10*6/uL (ref 3.70–5.45)

## 2009-02-11 LAB — COMPREHENSIVE METABOLIC PANEL
Alkaline Phosphatase: 65 U/L (ref 39–117)
CO2: 24 mEq/L (ref 19–32)
Creatinine, Ser: 0.72 mg/dL (ref 0.40–1.20)
Glucose, Bld: 93 mg/dL (ref 70–99)
Sodium: 139 mEq/L (ref 135–145)
Total Bilirubin: 0.4 mg/dL (ref 0.3–1.2)
Total Protein: 6.3 g/dL (ref 6.0–8.3)

## 2009-03-10 ENCOUNTER — Ambulatory Visit: Payer: Self-pay | Admitting: Cardiovascular Disease

## 2009-03-10 ENCOUNTER — Ambulatory Visit: Admission: RE | Admit: 2009-03-10 | Discharge: 2009-03-10 | Payer: Self-pay | Admitting: Oncology

## 2009-03-10 ENCOUNTER — Encounter: Payer: Self-pay | Admitting: Oncology

## 2009-03-12 ENCOUNTER — Ambulatory Visit: Payer: Self-pay | Admitting: Oncology

## 2009-03-12 LAB — CBC WITH DIFFERENTIAL/PLATELET
Basophils Absolute: 0 10*3/uL (ref 0.0–0.1)
Eosinophils Absolute: 0.2 10*3/uL (ref 0.0–0.5)
HCT: 34.3 % — ABNORMAL LOW (ref 34.8–46.6)
HGB: 11.8 g/dL (ref 11.6–15.9)
LYMPH%: 21.2 % (ref 14.0–49.7)
MCV: 92.2 fL (ref 79.5–101.0)
MONO#: 0.6 10*3/uL (ref 0.1–0.9)
MONO%: 9.8 % (ref 0.0–14.0)
NEUT#: 4 10*3/uL (ref 1.5–6.5)
NEUT%: 65.1 % (ref 38.4–76.8)
Platelets: 198 10*3/uL (ref 145–400)
WBC: 6.1 10*3/uL (ref 3.9–10.3)
nRBC: 0 % (ref 0–0)

## 2009-03-15 LAB — COMPREHENSIVE METABOLIC PANEL
ALT: 23 U/L (ref 0–35)
BUN: 15 mg/dL (ref 6–23)
CO2: 25 mEq/L (ref 19–32)
Calcium: 8.9 mg/dL (ref 8.4–10.5)
Chloride: 103 mEq/L (ref 96–112)
Creatinine, Ser: 0.7 mg/dL (ref 0.40–1.20)
Glucose, Bld: 147 mg/dL — ABNORMAL HIGH (ref 70–99)
Total Bilirubin: 0.3 mg/dL (ref 0.3–1.2)

## 2009-03-15 LAB — LACTATE DEHYDROGENASE: LDH: 156 U/L (ref 94–250)

## 2009-03-15 LAB — CANCER ANTIGEN 27.29: CA 27.29: 22 U/mL (ref 0–39)

## 2009-03-17 ENCOUNTER — Encounter: Admission: RE | Admit: 2009-03-17 | Discharge: 2009-03-17 | Payer: Self-pay | Admitting: Oncology

## 2009-03-19 ENCOUNTER — Encounter: Admission: RE | Admit: 2009-03-19 | Discharge: 2009-03-19 | Payer: Self-pay | Admitting: Oncology

## 2009-04-02 LAB — CBC WITH DIFFERENTIAL/PLATELET
Basophils Absolute: 0 10*3/uL (ref 0.0–0.1)
Eosinophils Absolute: 0.2 10*3/uL (ref 0.0–0.5)
HGB: 11.9 g/dL (ref 11.6–15.9)
LYMPH%: 22.4 % (ref 14.0–49.7)
MCV: 92.7 fL (ref 79.5–101.0)
MONO#: 0.5 10*3/uL (ref 0.1–0.9)
MONO%: 10.2 % (ref 0.0–14.0)
NEUT#: 3 10*3/uL (ref 1.5–6.5)
Platelets: 202 10*3/uL (ref 145–400)
WBC: 4.7 10*3/uL (ref 3.9–10.3)

## 2009-04-02 LAB — COMPREHENSIVE METABOLIC PANEL
Alkaline Phosphatase: 67 U/L (ref 39–117)
BUN: 16 mg/dL (ref 6–23)
CO2: 25 mEq/L (ref 19–32)
Creatinine, Ser: 0.75 mg/dL (ref 0.40–1.20)
Glucose, Bld: 91 mg/dL (ref 70–99)
Sodium: 139 mEq/L (ref 135–145)
Total Bilirubin: 0.4 mg/dL (ref 0.3–1.2)

## 2009-04-21 ENCOUNTER — Ambulatory Visit: Payer: Self-pay | Admitting: Oncology

## 2009-04-23 LAB — CBC WITH DIFFERENTIAL/PLATELET
BASO%: 0.7 % (ref 0.0–2.0)
Basophils Absolute: 0 10*3/uL (ref 0.0–0.1)
EOS%: 2.4 % (ref 0.0–7.0)
Eosinophils Absolute: 0.1 10*3/uL (ref 0.0–0.5)
HCT: 35.2 % (ref 34.8–46.6)
HGB: 11.9 g/dL (ref 11.6–15.9)
LYMPH%: 18.3 % (ref 14.0–49.7)
MCH: 31.5 pg (ref 25.1–34.0)
MCHC: 33.8 g/dL (ref 31.5–36.0)
MCV: 93.1 fL (ref 79.5–101.0)
MONO#: 0.7 10*3/uL (ref 0.1–0.9)
MONO%: 11.4 % (ref 0.0–14.0)
NEUT#: 4 10*3/uL (ref 1.5–6.5)
NEUT%: 67.2 % (ref 38.4–76.8)
Platelets: 214 10*3/uL (ref 145–400)
RBC: 3.78 10*6/uL (ref 3.70–5.45)
RDW: 12.9 % (ref 11.2–14.5)
WBC: 5.9 10*3/uL (ref 3.9–10.3)
lymph#: 1.1 10*3/uL (ref 0.9–3.3)
nRBC: 0 % (ref 0–0)

## 2009-04-23 LAB — COMPREHENSIVE METABOLIC PANEL
ALT: 18 U/L (ref 0–35)
AST: 21 U/L (ref 0–37)
Albumin: 3.8 g/dL (ref 3.5–5.2)
BUN: 19 mg/dL (ref 6–23)
Calcium: 9 mg/dL (ref 8.4–10.5)
Chloride: 103 mEq/L (ref 96–112)
Potassium: 4 mEq/L (ref 3.5–5.3)
Sodium: 138 mEq/L (ref 135–145)
Total Protein: 7.1 g/dL (ref 6.0–8.3)

## 2009-04-23 LAB — CANCER ANTIGEN 27.29: CA 27.29: 24 U/mL (ref 0–39)

## 2009-06-25 ENCOUNTER — Ambulatory Visit: Payer: Self-pay | Admitting: Oncology

## 2009-06-29 ENCOUNTER — Ambulatory Visit: Admission: RE | Admit: 2009-06-29 | Discharge: 2009-06-29 | Payer: Self-pay | Admitting: Oncology

## 2009-06-29 ENCOUNTER — Encounter: Payer: Self-pay | Admitting: Oncology

## 2009-08-26 ENCOUNTER — Ambulatory Visit: Payer: Self-pay | Admitting: Oncology

## 2009-08-30 LAB — COMPREHENSIVE METABOLIC PANEL
ALT: 19 U/L (ref 0–35)
AST: 24 U/L (ref 0–37)
CO2: 25 mEq/L (ref 19–32)
Calcium: 9.2 mg/dL (ref 8.4–10.5)
Chloride: 102 mEq/L (ref 96–112)
Glucose, Bld: 117 mg/dL — ABNORMAL HIGH (ref 70–99)
Potassium: 3.9 mEq/L (ref 3.5–5.3)
Total Protein: 6.4 g/dL (ref 6.0–8.3)

## 2009-08-30 LAB — CBC WITH DIFFERENTIAL/PLATELET
BASO%: 0.2 % (ref 0.0–2.0)
Basophils Absolute: 0 10*3/uL (ref 0.0–0.1)
EOS%: 1.6 % (ref 0.0–7.0)
Eosinophils Absolute: 0.1 10*3/uL (ref 0.0–0.5)
HCT: 38.5 % (ref 34.8–46.6)
HGB: 12.7 g/dL (ref 11.6–15.9)
LYMPH%: 26.2 % (ref 14.0–49.7)
MCH: 31.3 pg (ref 25.1–34.0)
MONO%: 10.4 % (ref 0.0–14.0)
NEUT#: 2.8 10*3/uL (ref 1.5–6.5)
RBC: 4.06 10*6/uL (ref 3.70–5.45)
WBC: 4.5 10*3/uL (ref 3.9–10.3)
lymph#: 1.2 10*3/uL (ref 0.9–3.3)

## 2009-08-30 LAB — LACTATE DEHYDROGENASE: LDH: 149 U/L (ref 94–250)

## 2009-08-30 LAB — VITAMIN D 25 HYDROXY (VIT D DEFICIENCY, FRACTURES): Vit D, 25-Hydroxy: 35 ng/mL (ref 30–89)

## 2009-10-26 ENCOUNTER — Ambulatory Visit: Payer: Self-pay | Admitting: Oncology

## 2009-12-27 ENCOUNTER — Ambulatory Visit: Payer: Self-pay | Admitting: Oncology

## 2009-12-28 LAB — COMPREHENSIVE METABOLIC PANEL
AST: 24 U/L (ref 0–37)
BUN: 14 mg/dL (ref 6–23)
CO2: 25 mEq/L (ref 19–32)
Calcium: 8.8 mg/dL (ref 8.4–10.5)
Creatinine, Ser: 0.71 mg/dL (ref 0.40–1.20)
Glucose, Bld: 126 mg/dL — ABNORMAL HIGH (ref 70–99)
Potassium: 3.9 mEq/L (ref 3.5–5.3)

## 2009-12-28 LAB — CBC WITH DIFFERENTIAL/PLATELET
BASO%: 0.1 % (ref 0.0–2.0)
Basophils Absolute: 0 10*3/uL (ref 0.0–0.1)
Eosinophils Absolute: 0.1 10*3/uL (ref 0.0–0.5)
HCT: 35.3 % (ref 34.8–46.6)
HGB: 12.4 g/dL (ref 11.6–15.9)
LYMPH%: 20.2 % (ref 14.0–49.7)
MCHC: 35 g/dL (ref 31.5–36.0)
MCV: 94.5 fL (ref 79.5–101.0)
MONO#: 0.5 10*3/uL (ref 0.1–0.9)
MONO%: 11.8 % (ref 0.0–14.0)
NEUT#: 2.9 10*3/uL (ref 1.5–6.5)
RBC: 3.74 10*6/uL (ref 3.70–5.45)
RDW: 13.4 % (ref 11.2–14.5)
WBC: 4.5 10*3/uL (ref 3.9–10.3)

## 2009-12-28 LAB — CANCER ANTIGEN 27.29: CA 27.29: 26 U/mL (ref 0–39)

## 2010-02-03 IMAGING — MG MM BREAST WIRE LOCALIZATION*R*
4 series · 4 of 4 positions shown · non-contrast
Comparison: none

CLINICAL DATA: Right breast mass.

[R LM (1 of 2)]
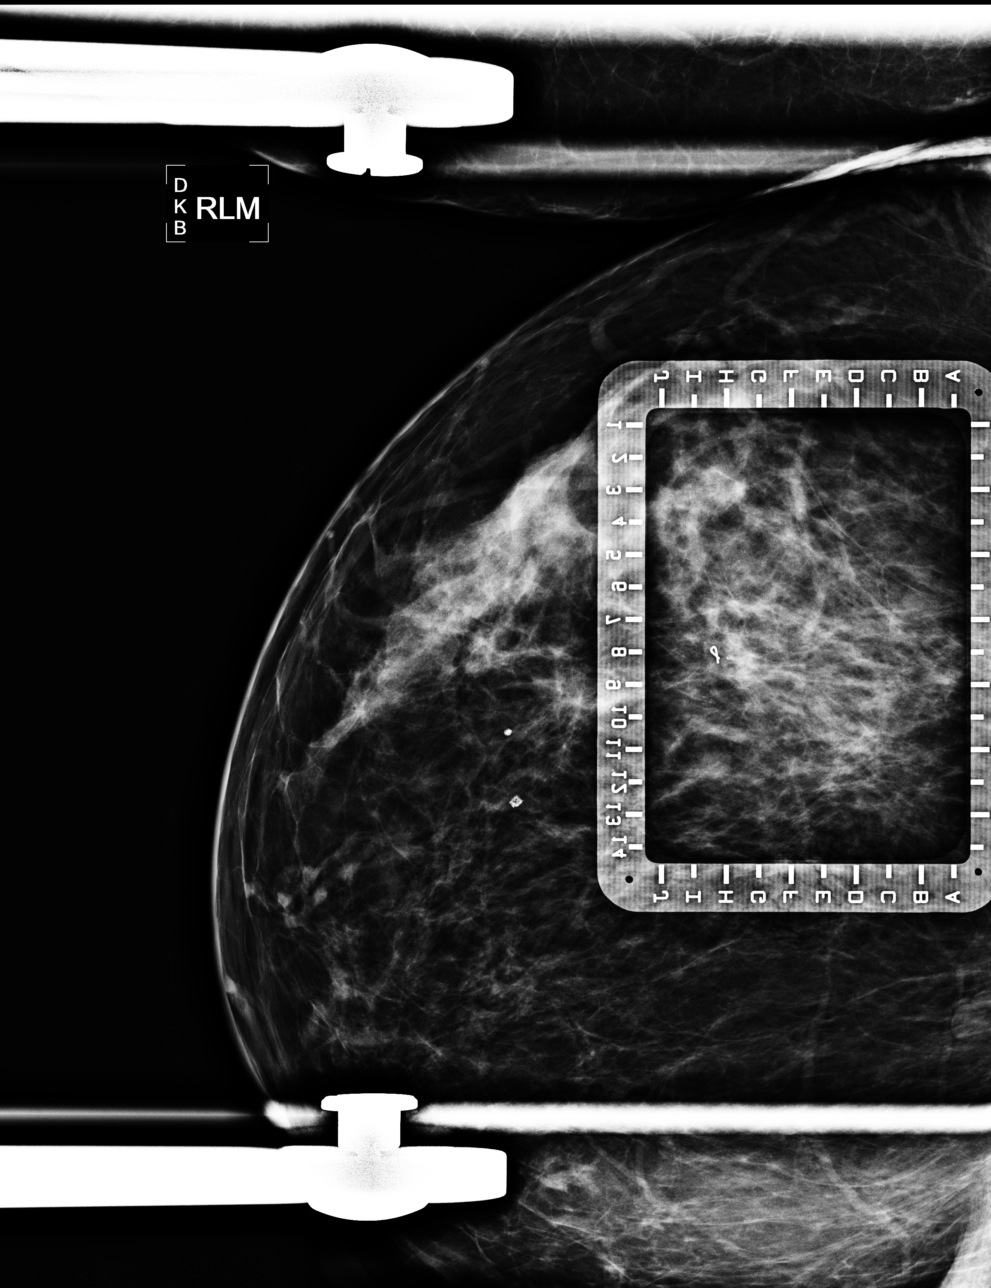

[R LM (2 of 2)]
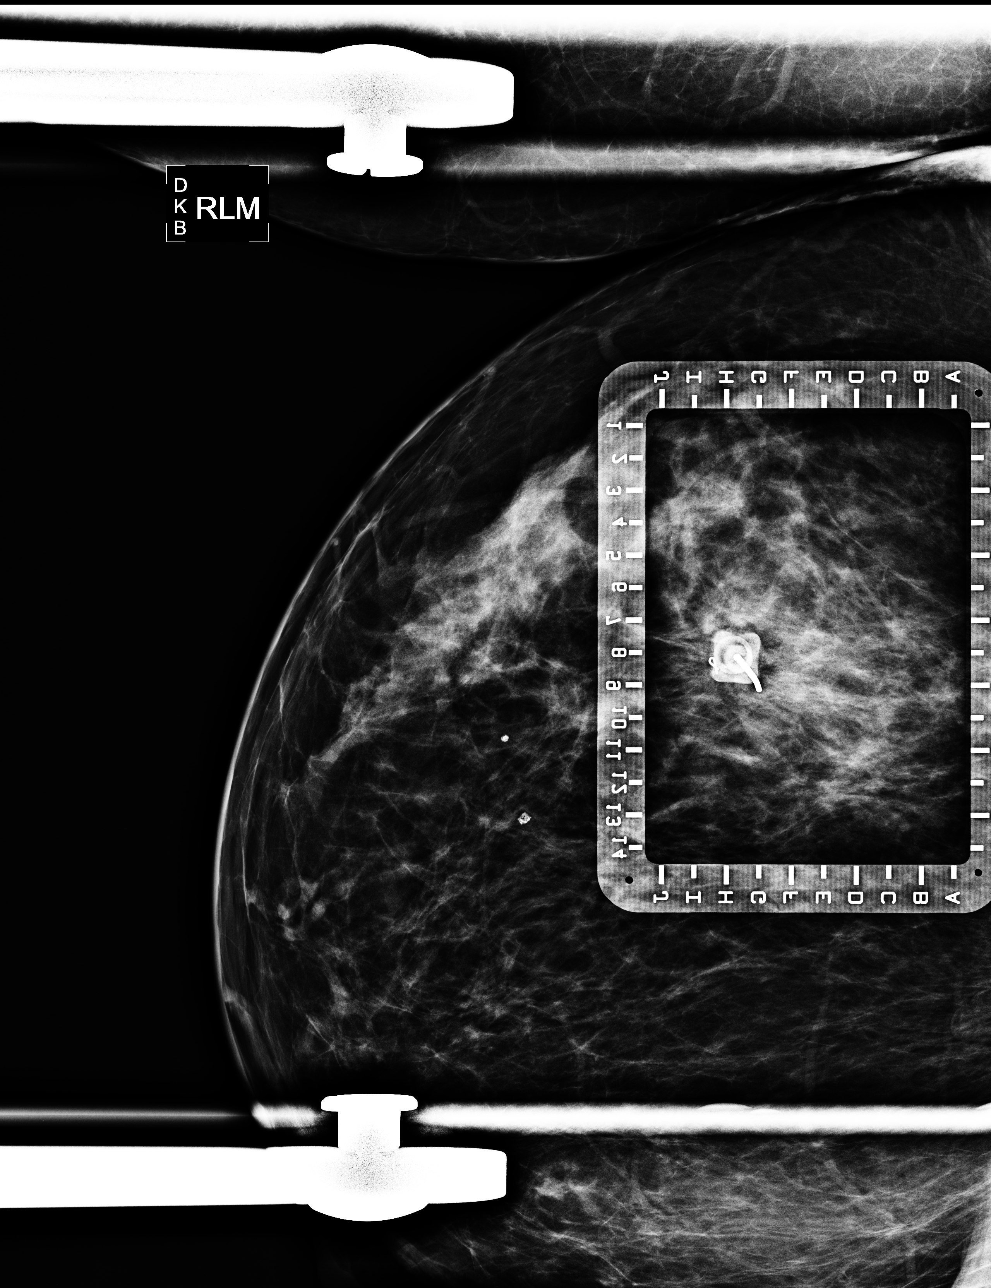

[R CC (1 of 2)]
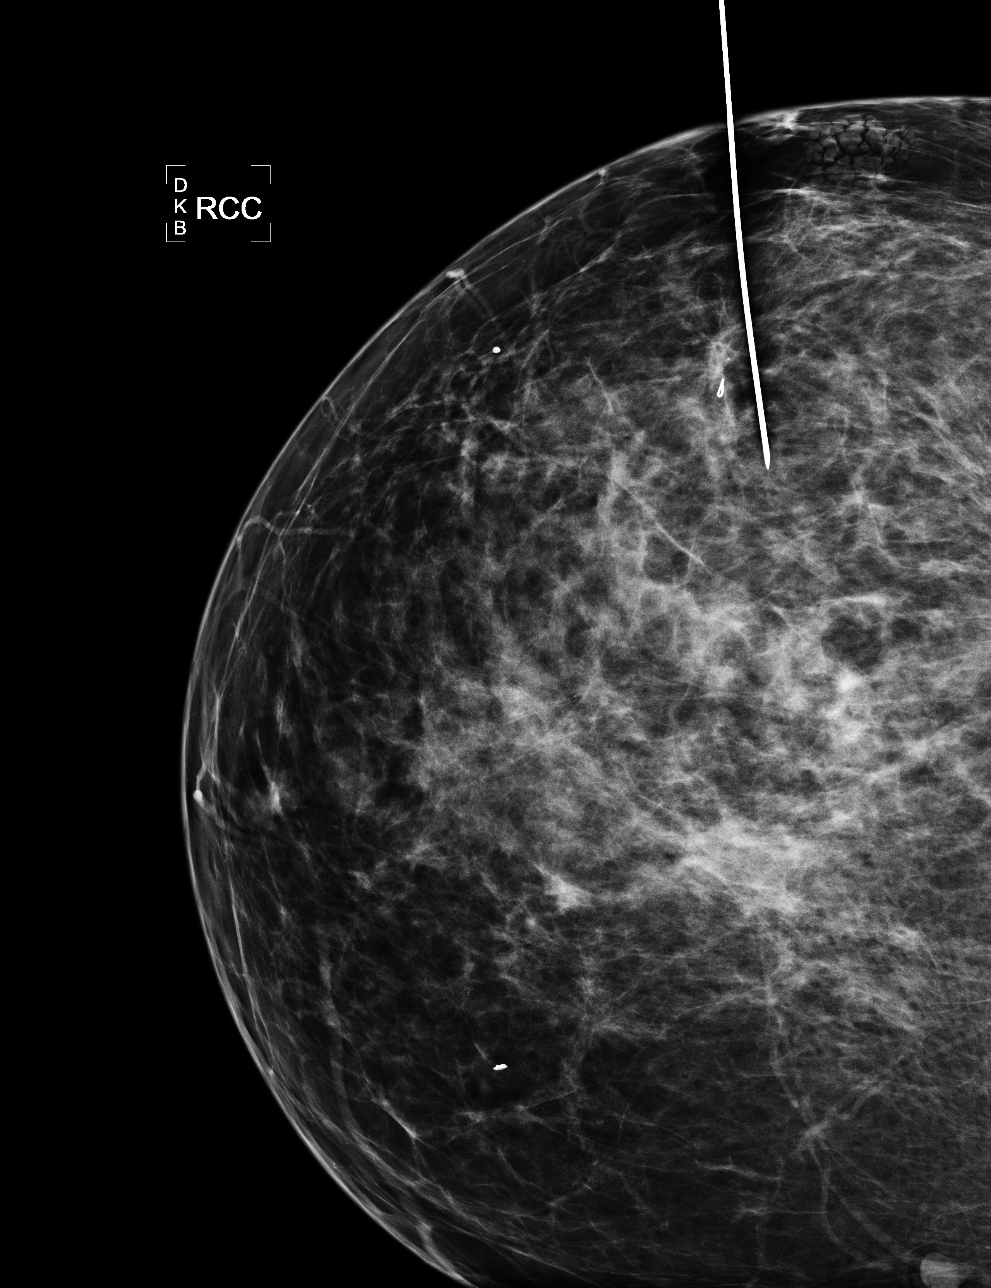

[R CC (2 of 2)]
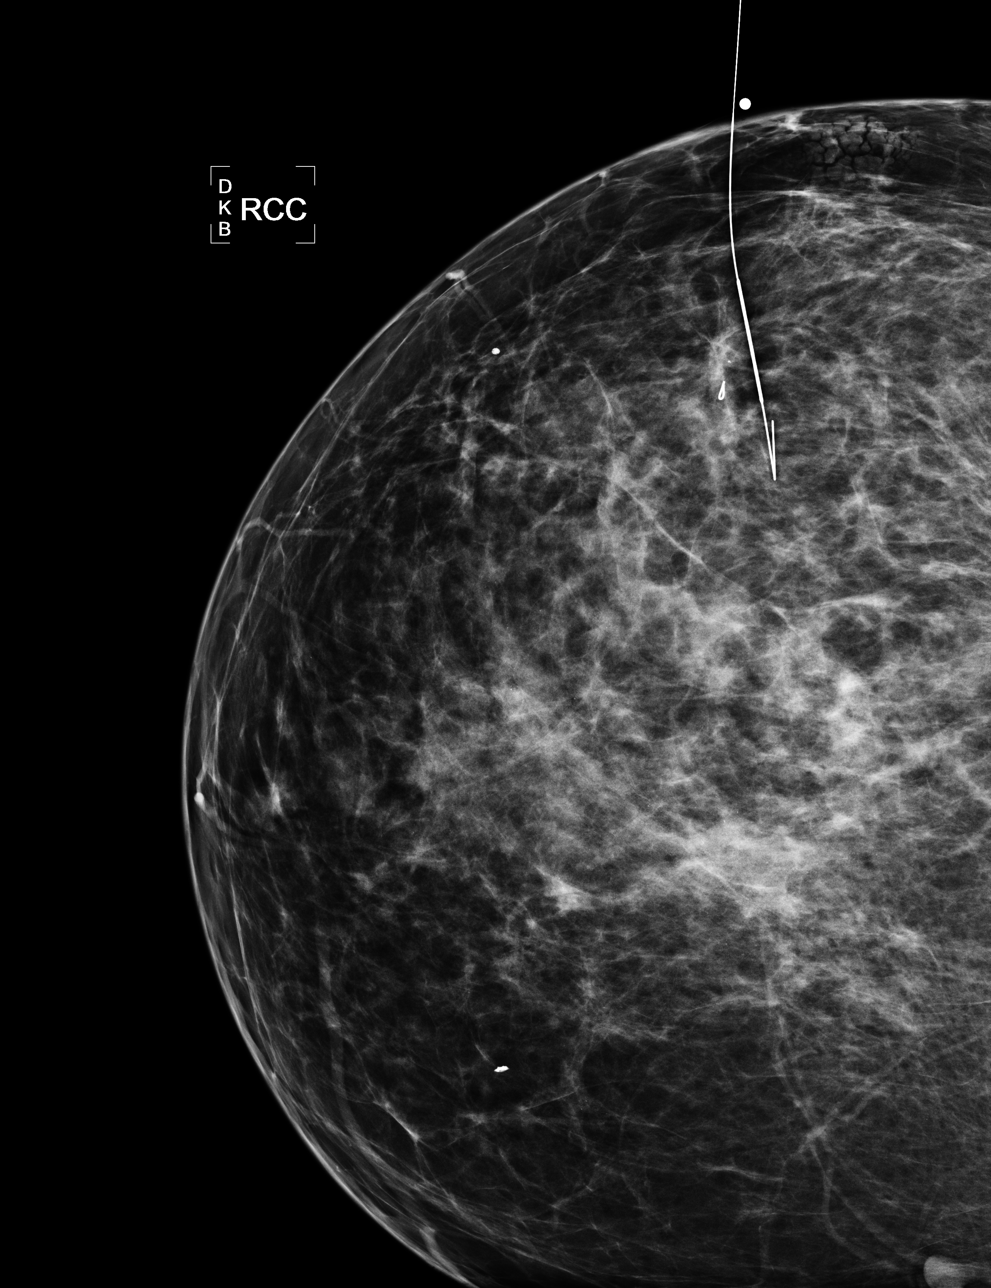

[4 of 4 positions shown; findings below may reference images not displayed]

NEEDLE LOCALIZATION WITH MAMMOGRAPHIC GUIDANCE AND SPECIMEN
RADIOGRAPH

Patient presents for needle localization prior to surgical excision
of right breast mass..  I met with the patient and we discussed the
procedure of needle localization. Informed written consent was
given.

Using mammographic guidance, sterile technique, 2% lidocaine and a
9 cm modified Kopans needle, the right breast mass was localized
using a lateral approach.  The films was marked for Dr. Nunuy.

Specimen radiograph was performed at [facility], and confirms the
clip and localization wire to be present in the tissue sample.  The
specimen is marked for pathology.
IMPRESSION: Needle localization of the right breast mass as discussed above.
No immediate complications.

## 2010-03-10 IMAGING — CR DG CHEST 1V PORT
1 series · 1 of 1 positions shown · non-contrast
Comparison: Chest x-ray of 03/17/2008

CLINICAL DATA: Breast cancer, status post Port-A-Cath insertion

PORTABLE CHEST - 1 VIEW

[view not recorded]
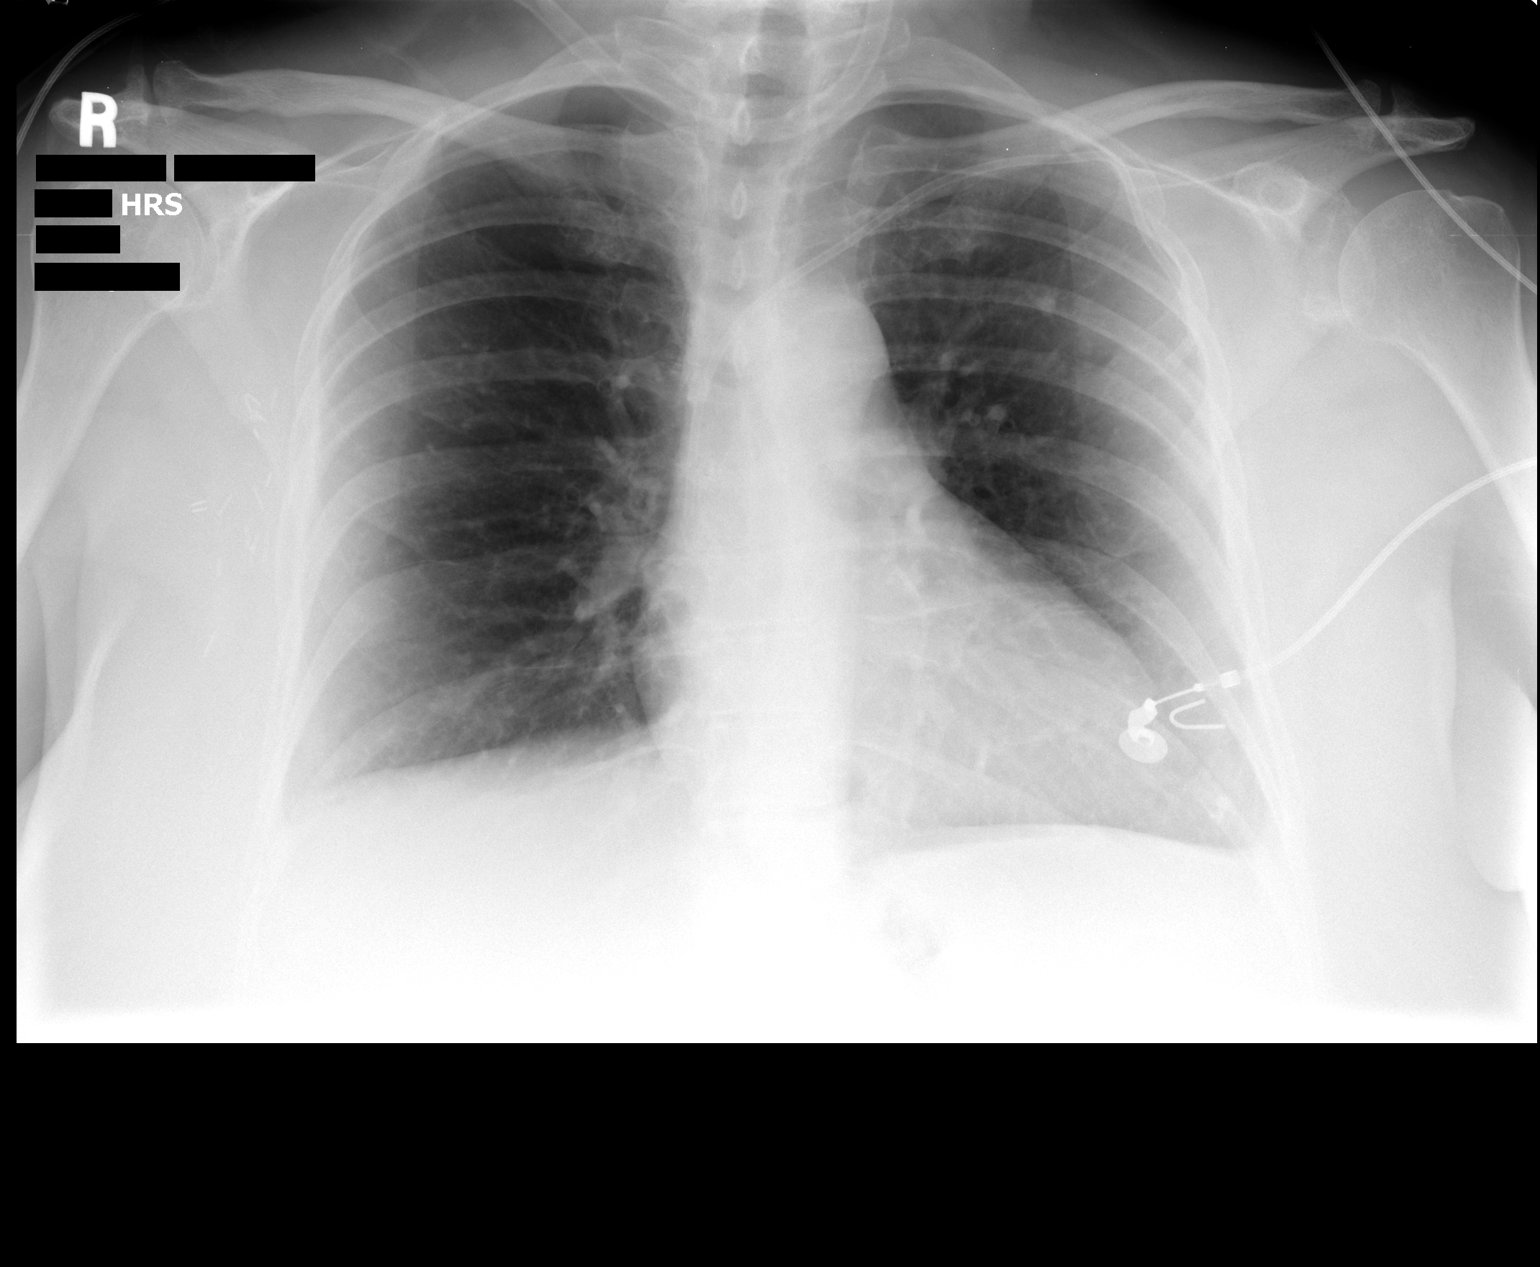

[1 of 1 positions shown; findings below may reference images not displayed]

FINDINGS: The left Port-A-Cath tip is in the lower SVC in good
position.  No pneumothorax is seen.  The lungs are clear.  Heart
size is stable.
IMPRESSION: Left Port-A-Cath tip in lower SVC.  No pneumothorax.

## 2010-03-14 ENCOUNTER — Ambulatory Visit: Payer: Self-pay | Admitting: Oncology

## 2010-03-23 ENCOUNTER — Encounter: Admission: RE | Admit: 2010-03-23 | Discharge: 2010-03-23 | Payer: Self-pay | Admitting: Oncology

## 2010-04-28 ENCOUNTER — Ambulatory Visit: Payer: Self-pay | Admitting: Oncology

## 2010-08-25 ENCOUNTER — Ambulatory Visit (HOSPITAL_BASED_OUTPATIENT_CLINIC_OR_DEPARTMENT_OTHER): Payer: Medicare Other | Admitting: Oncology

## 2010-09-14 ENCOUNTER — Ambulatory Visit (HOSPITAL_BASED_OUTPATIENT_CLINIC_OR_DEPARTMENT_OTHER): Payer: Medicare Other | Admitting: Oncology

## 2010-09-14 DIAGNOSIS — C50419 Malignant neoplasm of upper-outer quadrant of unspecified female breast: Secondary | ICD-10-CM

## 2010-09-14 LAB — CBC WITH DIFFERENTIAL/PLATELET
EOS%: 1.7 % (ref 0.0–7.0)
MCH: 32 pg (ref 25.1–34.0)
MONO%: 14.6 % — ABNORMAL HIGH (ref 0.0–14.0)
NEUT%: 60.8 % (ref 38.4–76.8)
Platelets: 223 10*3/uL (ref 145–400)
RDW: 13.3 % (ref 11.2–14.5)
WBC: 4.8 10*3/uL (ref 3.9–10.3)

## 2010-09-14 LAB — COMPREHENSIVE METABOLIC PANEL
ALT: 18 U/L (ref 0–35)
Albumin: 4.1 g/dL (ref 3.5–5.2)
Alkaline Phosphatase: 77 U/L (ref 39–117)
Total Bilirubin: 0.4 mg/dL (ref 0.3–1.2)
Total Protein: 6.9 g/dL (ref 6.0–8.3)

## 2010-12-27 NOTE — Op Note (Signed)
NAMERODERICK, SWEEZY                ACCOUNT NO.:  [id number]   MEDICAL RECORD NO.:  [id number]          PATIENT TYPE:  AMB   LOCATION:  DSC                          FACILITY:  [facility]   PHYSICIAN:  Angelia Mould. Derrell Lolling, M.D.DATE OF BIRTH:  1940-12-23   DATE OF PROCEDURE:  04/24/2008  DATE OF DISCHARGE:  04/21/2008                               OPERATIVE REPORT   PREOPERATIVE DIAGNOSIS:  Invasive carcinoma, right breast.   POSTOPERATIVE DIAGNOSIS:  Invasive carcinoma, right breast.   OPERATION PERFORMED:  Insertion 8.0 [place] X-Port venous vascular access  device with fluoroscopic guidance.   SURGEON:  Angelia Mould. 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  She has been evaluated by Dr. Pierce Crane and Dr. Jodi Geralds.  Plan is for adjuvant chemotherapy, initially and to be  followed later by adjuvant radiation therapy.  Dr. Donnie Coffin has asked that  we place a Port-a-Cath.  The patient has been counseled as an outpatient  regarding this procedure.  She was brought to the operating room  electively.   OPERATIVE TECHNIQUE:  The patient was brought to the operating room and  was placed in the operating table with her arms at her sides and a small  roll behind her shoulders.  The arms were tucked.  The patient was  monitored and sedated by the anesthesia department.  The patient was  identified as to correct patient and correct procedure.  Intravenous  antibiotics were given.  The neck and chest were prepped and draped in  sterile fashion.  Xylocaine 1% with epinephrine was used as a local  infiltration anesthetic.   A left subclavian venipuncture was performed without difficulty and a  guidewire inserted into the superior vena cava and actually into the  right atrium under fluoroscopic guidance.  Using the  fluoroscope, we  marked template on the chest wall as to where the tip of the catheter  should be and the length of the catheter.  We then made a small incision  at the wire insertion site.  We then made about a 3-cm transverse  incision in the left infraclavicular area about 3 cm below the wire  insertion site.  A subcutaneous pocket was created down to the level of  pectoralis fascia.  The catheter was tunneled between the wire insertion  site and the port pocket site using the tendon-pulling forceps.  We then  used the template marked on the chest wall to mark the appropriate  length of the catheter at about 21.5 cm.  We then cut the catheter and  then secured it to the port with the locking device.  The port and  catheter were then flushed with heparinized saline.  The port was  sutured to the pectoralis fascia with 2 interrupted sutures of 2-0  Prolene.  With the patient back in the Trendelenburg position, we passed  the dilator and peel-away sheath assembly over the guidewire.  This  went  uneventfully.  The guidewire and the dilator were removed.  We had a  good blood return.  We inserted the catheter through the peel-away  sheath into the central venous circulation and removed the peel-away  sheath.  The catheter flushed easily and had excellent blood return.  The catheter was flushed with heparinized saline.  Once again we used  fluoroscopy to check the course of the catheter all the way from the  port down to the tip of the catheter.  The tip of the catheter appeared  to be at the junction of the superior vena cava and the right atrium.  There was no kink or deformity of the catheter anywhere along its  course.  The catheter was then flushed with concentrated heparin  solution.  The subcutaneous tissues were closed with interrupted suture  of 3-0  Vicryl and both skin incisions were closed with subcuticular sutures of  4-0 Monocryl and Steri-Strips.  Clean bandages were placed  and the  patient was taken to recovery room in stable condition.  Estimated blood  loss was about 10 mL.  Complications none.  Sponge, needle, and  instrument counts were correct.      Angelia Mould. Derrell Lolling, M.D.  Electronically Signed     HMI/MEDQ  D:  04/24/2008  T:  04/24/2008  Job:  [id number]   cc:   Orpha Bur, MD

## 2010-12-27 NOTE — Op Note (Signed)
Caitlin Cordova, Caitlin Cordova                ACCOUNT NO.:  [id number]   MEDICAL RECORD NO.:  [id number]          PATIENT TYPE:  AMB   LOCATION:  DSC                          FACILITY:  [facility]   PHYSICIAN:  Angelia Mould. Derrell Lolling, M.D.DATE OF BIRTH:  1941/07/18   DATE OF PROCEDURE:  03/20/2008  DATE OF DISCHARGE:                               OPERATIVE REPORT   PREOPERATIVE DIAGNOSIS:  Carcinoma of the right breast.   POSTOPERATIVE DIAGNOSIS:  Carcinoma of the right breast with axillary  lymph node metastasis.   OPERATIONS PERFORMED:  1. Inject blue dye, right breast.  2. Right partial mastectomy with needle localization and specimen      mammogram.  3. Right axillary sentinel node biopsy.  4. Right axillary lymph node dissection.   SURGEON:  Angelia Mould. Derrell Lolling, MD   OPERATIVE INDICATIONS:  This is a 70 year old white female who underwent  mammography, which showed a small mass in the right breast in the upper  outer quadrant.  Imaging studies suggested an 8-mm mass at 10:30  position of the right breast and 12 cm from the nipple.  A biopsy was  performed and a clip was placed, and the biopsy showed invasive mammary  carcinoma.  This was hormone receptor negative but HER-2/neu positive.  There was a question of an abnormal lymph node, which was biopsied and  showed benign fibroadipose tissue.  She had an MRI, which showed a  solitary mass in the upper outer quadrant of right breast.  The lymph  nodes were said to be normal.  She has seen me as an outpatient and has  been counseled.  She is interested in breast conservation.  She is  brought to the operating room electively.   OPERATIVE TECHNIQUE:  Following the placement of a localizing wire by  Dr. Anselmo Pickler of [facility], the patient was  brought to [facility].  In the holding area, she was  injected with radionuclide by the nuclear medicine staff.  She was then  taken to operating room.  General  anesthesia was induced.  Intravenous  antibiotics were given.  The patient was identified as the correct  patient, correct procedure, and correct site.  Marcaine 0.5% with  epinephrine was used as a local infiltration anesthetic.  The right  breast was prepped and draped in a sterile fashion as was the axilla and  chest wall.   I observed the localizing wire entering laterally in the upper outer  quadrant of the right breast and directed medially.  I reviewed all of  the films.  A curvilinear incision was made in the upper outer quadrant  of the right breast paralleling the areolar margin.  Dissection was  carried deeply into the breast tissue and widely around the localizing  wire.  Electrocautery was used to dissect the specimen away.  After the  specimen was removed, I used a 6-color margin marker kit to mark the  margins of the specimen.  Specimen mammography was performed and showed  that the marker was in the center of the specimen.  The  specimen was  then sent for routine histology.  The right breast wound was irrigated  with saline.  Hemostasis was excellent.  The deeper breast tissues were  closed with interrupted sutures of 3-0 Vicryl and the skin closed with a  running subcuticular suture of 4-0 Monocryl and Dermabond.   Attention was directed to the right axilla.  I used a NeoProbe to  identify an area of increased activity.  Transverse incision was made in  the right axilla at the hairline.  Dissection was carried down through  the subcutaneous tissue and through the clavipectoral fascia.  Using the  NeoProbe as a guide, I dissected out a solitary sentinel lymph node,  which was very hot and very blue.  After this was removed, there was not  much radioactivity, but I felt a very firm lymph node and I chose to  dissect out away as well.  Dr. Dierdre Searles in pathology said that the imprint  cytology on the sentinel node was negative, but that the non-sentinel  node was full of tumor  cells.  I felt that we should proceed with  axillary dissection.   Retractors were placed back in the wound.  I dissected all of the  axillary tissue down away from the lateral border of the pectoralis  major and pectoralis minor muscles.  The medial pectoral nerve was  preserved.  I took the dissection up toward the apex of the axilla.  I  identified the axillary vein.  A couple of superficial venous  tributaries were controlled with multiple metal clips and divided.  I  then dissected out all of the axillary contents between the  thoracodorsal neurovascular bundle and the chest wall.  I identified the  neurovascular bundle and preserved it.  I identified the long thoracic  nerve and preserved it.  All of the axillary contents were then  dissected out.  A few small blood vessels were controlled with metal  clips.  The specimen was sent for routine histology.  The right axillary  wound was irrigated with saline.  Hemostasis was excellent.  A 19-French  Blake drain was placed up into the right axillary wound and brought out  through a separate stab incision inferiorly, sutured to the skin with a  nylon suture and connected to suction bulb.  The right axillary incision  was closed with skin staples.  Clean bandages were placed, and the  patient was taken to recovery room in stable condition.  Estimated blood  loss was about 30 mL.   COMPLICATIONS:  None.   Sponge, needle, and instrument counts were correct.      Angelia Mould. Derrell Lolling, M.D.  Electronically Signed     HMI/MEDQ  D:  03/20/2008  T:  03/21/2008  Job:  [id number]   cc:   Ventura Sellers, M.D.  Pierce Crane, MD

## 2010-12-30 NOTE — Procedures (Signed)
[facility]  Patient:    ALEJA, YEARWOOD                     MRN: [id number] Proc. Date: 10/31/00 Adm. Date:  [id number] Attending:  Nelda Marseille CC:         Karlene Einstein, M.D.  Lenoard Aden, M.D.   Procedure Report  PROCEDURE PERFORMED:  Colonoscopy  INDICATION FOR PROCEDURE:  History of colon polyps, due for repeat screening. Consent was signed after risks, benefits, methods and options were thoroughly discussed in the office.  MEDICINES USED:  Demerol 100, Versed 8.  DESCRIPTION OF PROCEDURE:  Rectal inspection was pertinent for external hemorrhoids.  Digital exam was negative.  Video colonoscope was inserted and advanced to about 30 cm.  At that point, there was a tortuous curve in the sigmoid and despite abdominal pressure could not advance any further without significant looping and elected to withdraw.  Once withdrawn, the pediatric video colonoscope was inserted and much easier able to advance around the sigmoid to about the mid transverse.  At that point, there was some looping. Abdominal pressure was applied and we were easily able to advance to the cecum.  Other than some occasional left side sided diverticula no other abnormalities were seen on insertion.  The cecum was identified by the appendiceal orifice and the ileocecal valve.  The scope was slowly withdrawn. The prep was adequate.  There was minimal liquid stool that required washing and suctioning.  On slow withdrawal through the colon there was a minimal amount of transverse inflammation probably due to prep and scope trauma.  Did not seem significant. On slow withdraw other than the left sided diverticula, no additional findings were seen.  Specifically no polyps, tumors or masses. Once back in the rectum, the scope was retroflexed and pertinent for some internal hemorrhoids.  Scope was straightened.  Air was withdrawn and the scope removed.  The patient  tolerated the procedure well.  There was no obvious immediate complication.  ENDOSCOPIC DIAGNOSES: 1. Internal and external hemorrhoids. 2. Tortuous sigmoid. 3. Occasional left sided diverticula. 4. Unable to advance the regular scope passed the sigmoid.  Able to advance    the pediatric scope. 5. Otherwise within normal limits to the cecum.  PLAN:  Yearly rectal and guaiac by either Dr. Kerry Dory or Dr. Billy Coast. Continue Aciphex.  GI follow p.r.n. or otherwise repeat colonic screening in five years unless needed sooner because of GI symptoms or concerns.DD: 10/31/00 TD:  11/01/00 Job: [id number]

## 2011-01-13 ENCOUNTER — Encounter (INDEPENDENT_AMBULATORY_CARE_PROVIDER_SITE_OTHER): Payer: Self-pay | Admitting: General Surgery

## 2011-01-16 ENCOUNTER — Encounter (HOSPITAL_BASED_OUTPATIENT_CLINIC_OR_DEPARTMENT_OTHER): Payer: Medicare Other | Admitting: Oncology

## 2011-01-16 ENCOUNTER — Other Ambulatory Visit: Payer: Self-pay | Admitting: Oncology

## 2011-01-16 ENCOUNTER — Other Ambulatory Visit: Payer: Self-pay | Admitting: Physician Assistant

## 2011-01-16 DIAGNOSIS — Z171 Estrogen receptor negative status [ER-]: Secondary | ICD-10-CM

## 2011-01-16 DIAGNOSIS — Z853 Personal history of malignant neoplasm of breast: Secondary | ICD-10-CM

## 2011-01-16 DIAGNOSIS — C50419 Malignant neoplasm of upper-outer quadrant of unspecified female breast: Secondary | ICD-10-CM

## 2011-01-16 LAB — COMPREHENSIVE METABOLIC PANEL
ALT: 21 U/L (ref 0–35)
AST: 26 U/L (ref 0–37)
Albumin: 3.9 g/dL (ref 3.5–5.2)
Calcium: 9.2 mg/dL (ref 8.4–10.5)
Chloride: 103 mEq/L (ref 96–112)
Potassium: 3.7 mEq/L (ref 3.5–5.3)
Total Protein: 6.7 g/dL (ref 6.0–8.3)

## 2011-01-16 LAB — CBC WITH DIFFERENTIAL/PLATELET
BASO%: 0.3 % (ref 0.0–2.0)
Basophils Absolute: 0 10*3/uL (ref 0.0–0.1)
EOS%: 3.9 % (ref 0.0–7.0)
HGB: 12.5 g/dL (ref 11.6–15.9)
MCH: 32.6 pg (ref 25.1–34.0)
MCHC: 34.6 g/dL (ref 31.5–36.0)
RDW: 13.1 % (ref 11.2–14.5)
lymph#: 0.9 10*3/uL (ref 0.9–3.3)

## 2011-03-27 ENCOUNTER — Ambulatory Visit
Admission: RE | Admit: 2011-03-27 | Discharge: 2011-03-27 | Disposition: A | Discharge status: Home / Self Care | Payer: Medicare Other | Source: Ambulatory Visit | Attending: Oncology | Admitting: Oncology

## 2011-03-27 DIAGNOSIS — Z853 Personal history of malignant neoplasm of breast: Secondary | ICD-10-CM

## 2011-05-12 LAB — COMPREHENSIVE METABOLIC PANEL
ALT: 25
AST: 29
Albumin: 3.7
Alkaline Phosphatase: 62
BUN: 10
CO2: 30
Calcium: 9.3
Chloride: 104
Creatinine, Ser: 0.7
GFR calc Af Amer: >60
GFR calc non Af Amer: >60
Glucose, Bld: 102 — ABNORMAL HIGH
Potassium: 4.2
Sodium: 137
Total Bilirubin: 0.8
Total Protein: 7.3

## 2011-05-12 LAB — POCT HEMOGLOBIN-HEMACUE: Hemoglobin: 14.5

## 2011-05-12 LAB — CBC
MCHC: 33.8
Platelets: 262
RBC: 4.51
RDW: 12.9

## 2011-05-12 LAB — URINALYSIS, ROUTINE W REFLEX MICROSCOPIC
Bilirubin Urine: NEGATIVE
Ketones, ur: NEGATIVE
Nitrite: NEGATIVE
Protein, ur: NEGATIVE
Specific Gravity, Urine: 1.007
Urobilinogen, UA: 0.2

## 2011-05-12 LAB — DIFFERENTIAL
Eosinophils Absolute: 0.1
Eosinophils Relative: 2
Lymphocytes Relative: 29
Lymphs Abs: 1.7
Monocytes Relative: 11

## 2011-05-17 LAB — BASIC METABOLIC PANEL
BUN: 11
CO2: 26
Chloride: 105
Creatinine, Ser: 0.7
GFR calc Af Amer: >60
Potassium: 4.4

## 2011-05-22 ENCOUNTER — Other Ambulatory Visit: Payer: Self-pay | Admitting: Physician Assistant

## 2011-05-22 ENCOUNTER — Encounter (HOSPITAL_BASED_OUTPATIENT_CLINIC_OR_DEPARTMENT_OTHER): Payer: Medicare Other | Admitting: Oncology

## 2011-05-22 DIAGNOSIS — Z171 Estrogen receptor negative status [ER-]: Secondary | ICD-10-CM

## 2011-05-22 DIAGNOSIS — C50419 Malignant neoplasm of upper-outer quadrant of unspecified female breast: Secondary | ICD-10-CM

## 2011-05-22 LAB — CBC WITH DIFFERENTIAL/PLATELET
BASO%: 0.3 % (ref 0.0–2.0)
Basophils Absolute: 0 10*3/uL (ref 0.0–0.1)
HCT: 37.4 % (ref 34.8–46.6)
HGB: 13 g/dL (ref 11.6–15.9)
LYMPH%: 17.2 % (ref 14.0–49.7)
MCHC: 34.8 g/dL (ref 31.5–36.0)
MONO#: 0.6 10*3/uL (ref 0.1–0.9)
NEUT%: 67.4 % (ref 38.4–76.8)
Platelets: 212 10*3/uL (ref 145–400)
WBC: 5.1 10*3/uL (ref 3.9–10.3)
lymph#: 0.9 10*3/uL (ref 0.9–3.3)

## 2011-05-22 LAB — COMPREHENSIVE METABOLIC PANEL
ALT: 17 U/L (ref 0–35)
BUN: 18 mg/dL (ref 6–23)
CO2: 23 mEq/L (ref 19–32)
Calcium: 8.8 mg/dL (ref 8.4–10.5)
Chloride: 103 mEq/L (ref 96–112)
Creatinine, Ser: 0.67 mg/dL (ref 0.50–1.10)
Glucose, Bld: 102 mg/dL — ABNORMAL HIGH (ref 70–99)
Total Bilirubin: 0.3 mg/dL (ref 0.3–1.2)

## 2011-05-22 LAB — LACTATE DEHYDROGENASE: LDH: 144 U/L (ref 94–250)

## 2011-05-22 LAB — CANCER ANTIGEN 27.29: CA 27.29: 23 U/mL (ref 0–39)

## 2011-07-17 ENCOUNTER — Ambulatory Visit: Payer: Medicare Other

## 2011-09-13 ENCOUNTER — Other Ambulatory Visit: Payer: Self-pay | Admitting: *Deleted

## 2011-09-13 ENCOUNTER — Ambulatory Visit (HOSPITAL_BASED_OUTPATIENT_CLINIC_OR_DEPARTMENT_OTHER): Payer: Medicare Other

## 2011-09-13 DIAGNOSIS — Z469 Encounter for fitting and adjustment of unspecified device: Secondary | ICD-10-CM

## 2011-09-13 DIAGNOSIS — C50919 Malignant neoplasm of unspecified site of unspecified female breast: Secondary | ICD-10-CM

## 2011-09-13 MED ORDER — SODIUM CHLORIDE 0.9 % IJ SOLN
10.0000 mL | INTRAMUSCULAR | Status: DC | PRN
  Administered 2011-09-13: 10 mL via INTRAVENOUS
  Filled 2011-09-13: qty 10

## 2011-09-13 MED ORDER — HEPARIN SOD (PORK) LOCK FLUSH 100 UNIT/ML IV SOLN
500.0000 [IU] | Freq: Once | INTRAVENOUS | Status: AC
  Administered 2011-09-13: 500 [IU] via INTRAVENOUS
  Filled 2011-09-13: qty 5

## 2011-11-13 ENCOUNTER — Other Ambulatory Visit: Payer: Medicare Other

## 2011-11-13 ENCOUNTER — Ambulatory Visit: Payer: Medicare Other | Admitting: Physician Assistant

## 2011-11-13 NOTE — Progress Notes (Signed)
POF sent to schedulers due to FTKA on 11/13/11

## 2011-11-14 ENCOUNTER — Telehealth: Payer: Self-pay | Admitting: *Deleted

## 2011-11-14 NOTE — Telephone Encounter (Signed)
left patient  voice message informing the patient of the new date and time of her rescheduled appointment

## 2011-11-27 ENCOUNTER — Other Ambulatory Visit: Payer: Medicare Other | Admitting: Lab

## 2011-11-30 ENCOUNTER — Ambulatory Visit: Payer: Medicare Other | Admitting: Physician Assistant

## 2011-11-30 ENCOUNTER — Other Ambulatory Visit: Payer: Medicare Other

## 2011-12-01 ENCOUNTER — Telehealth: Payer: Self-pay | Admitting: *Deleted

## 2011-12-01 NOTE — Progress Notes (Signed)
POF sent to schedulers, 2nd FTKA-CTS

## 2011-12-01 NOTE — Telephone Encounter (Signed)
left voice message to inform the patient of the new date and time on 01-12-2012 starting at 1:30p

## 2011-12-25 ENCOUNTER — Telehealth: Payer: Self-pay | Admitting: *Deleted

## 2011-12-25 NOTE — Telephone Encounter (Signed)
patient called in needing to reschedule her missed appointment but the patient appointment in not till 01-12-2012 left voice message to inform the patient of the 01-12-2012 appointment

## 2011-12-25 NOTE — Telephone Encounter (Signed)
per conversation with patient on 12-25-2011 changed appointment to 03-07-2012 at 11:30am patient confirmed over the phone

## 2012-01-03 ENCOUNTER — Telehealth: Payer: Self-pay | Admitting: *Deleted

## 2012-01-03 NOTE — Telephone Encounter (Signed)
patient called in and confirmed appointments for 01-10-2012 flush appointment 03-07-2012 starting at 11:30am with labs followed by md appointment at 12:00pm

## 2012-01-10 ENCOUNTER — Ambulatory Visit (HOSPITAL_BASED_OUTPATIENT_CLINIC_OR_DEPARTMENT_OTHER): Payer: Medicare Other

## 2012-01-10 VITALS — BP 156/81 | HR 77 | Temp 98.2°F

## 2012-01-10 DIAGNOSIS — Z452 Encounter for adjustment and management of vascular access device: Secondary | ICD-10-CM

## 2012-01-10 DIAGNOSIS — C50919 Malignant neoplasm of unspecified site of unspecified female breast: Secondary | ICD-10-CM

## 2012-01-10 MED ORDER — HEPARIN SOD (PORK) LOCK FLUSH 100 UNIT/ML IV SOLN
500.0000 [IU] | Freq: Once | INTRAVENOUS | Status: AC
  Administered 2012-01-10: 500 [IU] via INTRAVENOUS
  Filled 2012-01-10: qty 5

## 2012-01-10 MED ORDER — SODIUM CHLORIDE 0.9 % IJ SOLN
10.0000 mL | INTRAMUSCULAR | Status: DC | PRN
  Administered 2012-01-10: 10 mL via INTRAVENOUS
  Filled 2012-01-10: qty 10

## 2012-01-12 ENCOUNTER — Ambulatory Visit: Payer: Medicare Other | Admitting: Oncology

## 2012-01-12 ENCOUNTER — Other Ambulatory Visit: Payer: Medicare Other | Admitting: Lab

## 2012-01-15 ENCOUNTER — Other Ambulatory Visit: Payer: Self-pay | Admitting: Oncology

## 2012-01-15 DIAGNOSIS — Z853 Personal history of malignant neoplasm of breast: Secondary | ICD-10-CM

## 2012-03-07 ENCOUNTER — Other Ambulatory Visit (HOSPITAL_BASED_OUTPATIENT_CLINIC_OR_DEPARTMENT_OTHER): Payer: Medicare Other | Admitting: Lab

## 2012-03-07 ENCOUNTER — Ambulatory Visit (HOSPITAL_BASED_OUTPATIENT_CLINIC_OR_DEPARTMENT_OTHER): Payer: Medicare Other | Admitting: Oncology

## 2012-03-07 ENCOUNTER — Telehealth: Payer: Self-pay | Admitting: Oncology

## 2012-03-07 VITALS — BP 125/79 | HR 67 | Temp 98.0°F | Ht 62.0 in | Wt 191.6 lb

## 2012-03-07 DIAGNOSIS — C50419 Malignant neoplasm of upper-outer quadrant of unspecified female breast: Secondary | ICD-10-CM

## 2012-03-07 DIAGNOSIS — Z171 Estrogen receptor negative status [ER-]: Secondary | ICD-10-CM

## 2012-03-07 DIAGNOSIS — C50919 Malignant neoplasm of unspecified site of unspecified female breast: Secondary | ICD-10-CM

## 2012-03-07 DIAGNOSIS — Z452 Encounter for adjustment and management of vascular access device: Secondary | ICD-10-CM

## 2012-03-07 DIAGNOSIS — Z5112 Encounter for antineoplastic immunotherapy: Secondary | ICD-10-CM

## 2012-03-07 DIAGNOSIS — E559 Vitamin D deficiency, unspecified: Secondary | ICD-10-CM

## 2012-03-07 LAB — CBC WITH DIFFERENTIAL/PLATELET
Basophils Absolute: 0 10*3/uL (ref 0.0–0.1)
Eosinophils Absolute: 0.1 10*3/uL (ref 0.0–0.5)
HGB: 12.1 g/dL (ref 11.6–15.9)
MCV: 94.2 fL (ref 79.5–101.0)
MONO#: 0.6 10*3/uL (ref 0.1–0.9)
MONO%: 9.8 % (ref 0.0–14.0)
NEUT#: 4 10*3/uL (ref 1.5–6.5)
RBC: 3.76 10*6/uL (ref 3.70–5.45)
RDW: 12.8 % (ref 11.2–14.5)
WBC: 5.7 10*3/uL (ref 3.9–10.3)
lymph#: 1 10*3/uL (ref 0.9–3.3)

## 2012-03-07 NOTE — Progress Notes (Signed)
Hematology and Oncology Follow Up Visit  Caitlin Cordova [id number] 01/10/1941 71 y.o. 03/07/2012 1:03 PM   DIAGNOSIS:   Encounter Diagnosis  Name Primary?  Marland Kitchen Unspecified vitamin D deficiency Yes   Her2+ Breast Cancer  PAST THERAPY:   A 71 year old [place], [place], woman with a history of a HER2 positive, estrogen receptor/progesterone receptor negative, node-positive right breast carcinoma, diagnosed July 2009. She received 6 cycles of TCH chemotherapy followed by year of Herceptin followed by radiation. She has had a persistent erythematous right breast since completion of radiation.  Interim History:  She has been doing well without specific complaints,   Medications: I have reviewed the patient's current medications.  Allergies:  Allergies  Allergen Reactions  . Ivp Dye (Iodinated Diagnostic Agents) Rash    Past Medical History, Surgical history, Social history, and Family History were reviewed and updated.  Review of Systems: Constitutional:  Negative for fever, chills, night sweats, anorexia, weight loss, pain. Cardiovascular: no chest pain or dyspnea on exertion Respiratory: negative Neurological: negative Dermatological: negative ENT: negative Skin Gastrointestinal: negative Genito-Urinary: negative Hematological and Lymphatic: negative Breast: negative Musculoskeletal: negative Remaining ROS negative.  Physical Exam:  Blood pressure 125/79, pulse 67, temperature 98 F (36.7 C), height 5\' 2"  (1.575 m), weight 191 lb 9.6 oz (86.909 kg).  ECOG: 0   HEENT:  Sclerae anicteric, conjunctivae pink.  Oropharynx clear.  No mucositis or candidiasis.  Nodes:  No cervical, supraclavicular, or axillary lymphadenopathy palpated.  Breast Exam:  Right breast is s/plumpectomy and radiation. .  Left breast is benign.  No masses, discharge, skin change, or nipple inversion..  Lungs:  Clear to auscultation bilaterally.  No crackles, rhonchi, or wheezes.  Heart:   Regular rate and rhythm.  Abdomen:  Soft, nontender.  Positive bowel sounds.  No organomegaly or masses palpated.  Musculoskeletal:  No focal spinal tenderness to palpation.  Extremities:  Benign.  No peripheral edema or cyanosis.  Skin:  Benign.  Neuro:  Nonfocal.    Lab Results: Lab Results  Component Value Date   WBC 5.7 03/07/2012   HGB 12.1 03/07/2012   HCT 35.4 03/07/2012   MCV 94.2 03/07/2012   PLT 244 03/07/2012     Chemistry      Component Value Date/Time   NA 139 05/22/2011 0926   K 3.6 05/22/2011 0926   CL 103 05/22/2011 0926   CO2 23 05/22/2011 0926   BUN 18 05/22/2011 0926   CREATININE 0.67 05/22/2011 0926      Component Value Date/Time   CALCIUM 8.8 05/22/2011 0926   ALKPHOS 72 05/22/2011 0926   AST 23 05/22/2011 0926   ALT 17 05/22/2011 0926   BILITOT 0.3 05/22/2011 0926       Radiological Studies:  No results found.   IMPRESSIONS AND PLAN: A 71 y.o. female with   Hx of her2+ breast cancer s/p therapy with her2 based regimen followed by xrt and one year of herceptin. She is on regular f/u with no evidence for progression.  Spent more than half the time coordinating care, as well as discussion of BMI and its implications.      Kieli Golladay 7/25/20131:03 PM Cell [phone]

## 2012-03-07 NOTE — Telephone Encounter (Signed)
gve the pt her jan 2014 appt calendar °

## 2012-03-08 LAB — COMPREHENSIVE METABOLIC PANEL
Albumin: 3.6 g/dL (ref 3.5–5.2)
Alkaline Phosphatase: 67 U/L (ref 39–117)
BUN: 13 mg/dL (ref 6–23)
Calcium: 8.9 mg/dL (ref 8.4–10.5)
Chloride: 102 mEq/L (ref 96–112)
Glucose, Bld: 112 mg/dL — ABNORMAL HIGH (ref 70–99)
Potassium: 3.9 mEq/L (ref 3.5–5.3)
Sodium: 137 mEq/L (ref 135–145)
Total Protein: 6.4 g/dL (ref 6.0–8.3)

## 2012-03-08 LAB — CANCER ANTIGEN 27.29: CA 27.29: 19 U/mL (ref 0–39)

## 2012-03-28 ENCOUNTER — Ambulatory Visit
Admission: RE | Admit: 2012-03-28 | Discharge: 2012-03-28 | Disposition: A | Discharge status: Home / Self Care | Payer: Medicare Other | Source: Ambulatory Visit | Attending: Oncology | Admitting: Oncology

## 2012-03-28 DIAGNOSIS — Z853 Personal history of malignant neoplasm of breast: Secondary | ICD-10-CM

## 2012-06-11 ENCOUNTER — Encounter (INDEPENDENT_AMBULATORY_CARE_PROVIDER_SITE_OTHER): Payer: Self-pay | Admitting: General Surgery

## 2012-06-17 ENCOUNTER — Ambulatory Visit (INDEPENDENT_AMBULATORY_CARE_PROVIDER_SITE_OTHER): Payer: Medicare Other | Admitting: General Surgery

## 2012-06-17 ENCOUNTER — Encounter (INDEPENDENT_AMBULATORY_CARE_PROVIDER_SITE_OTHER): Payer: Self-pay | Admitting: General Surgery

## 2012-06-17 VITALS — BP 138/76 | HR 72 | Temp 97.8°F | Resp 14 | Ht 64.0 in | Wt 189.0 lb

## 2012-06-17 DIAGNOSIS — C50919 Malignant neoplasm of unspecified site of unspecified female breast: Secondary | ICD-10-CM

## 2012-06-17 DIAGNOSIS — C50911 Malignant neoplasm of unspecified site of right female breast: Secondary | ICD-10-CM

## 2012-06-17 NOTE — Patient Instructions (Signed)
Your breast exam and recent mammograms are normal. There is no evidence of recurrent breast cancer,  You will be scheduled for removal of the Port-A-Cath under local anesthesia in the office in the near future.

## 2012-06-17 NOTE — Progress Notes (Signed)
Patient ID: Caitlin Cordova, female   DOB: 1941/05/08, 71 y.o.   MRN: [id number]  Chief Complaint  Patient presents with  . Breast Cancer Long Term Follow Up    HPI Caitlin Cordova is a 71 y.o. female.  She was sent back to me by Dr. Pierce Crane for long-term followup and removal of her Port-A-Cath.  On 03/20/2008 she underwent right partial mastectomy and axillary lymph node dissection. She had a receptor-negative, HER-2 positive tumor, pathologic stage T1c, N1. She had 6 cycles of TCH chemotherapy and one year of her Herceptin. She has no known recurrence to date.  She says she still has chronic erythema of the right breast but this has not changed in several years. Last mammograms on 03/28/2012 are normal, category 2. She is now ready to have her Port-A-Cath removed. HPI  Past Medical History  Diagnosis Date  . Malayan filarial lymphodoema   . Hyperlipidemia   . GERD (gastroesophageal reflux disease)   . Hypertension   . Cancer 2009    breast  . Arthritis     Past Surgical History  Procedure Date  . Port inserted 2009  . Tacking up 2000  . Breast surgery 2009  . Abdominal hysterectomy 1990  . Cholecystectomy 1995 - approximate    Family History  Problem Relation Age of Onset  . Cancer Mother     colon    Social History History  Substance Use Topics  . Smoking status: Unknown If Ever Smoked  . Smokeless tobacco: Not on file  . Alcohol Use: No    Allergies  Allergen Reactions  . Ivp Dye (Iodinated Diagnostic Agents) Rash    Current Outpatient Prescriptions  Medication Sig Dispense Refill  . Cholecalciferol (VITAMIN D3) 2000 UNITS capsule Take 2,000 Units by mouth daily.      Marland Kitchen escitalopram (LEXAPRO) 10 MG tablet Daily.      . Magnesium 250 MG TABS Take by mouth daily. Patient not sure if 250 mg or 500 mg per dosage      . PRILOSEC OTC 20 MG tablet Daily.      Marland Kitchen aspirin 81 MG chewable tablet Chew 81 mg by mouth daily.        Marland Kitchen lisinopril (PRINIVIL,ZESTRIL) 10  MG tablet Take 10 mg by mouth daily.      . Omega-3 Fatty Acids (OMEGA 3 PO) Take by mouth.        . simvastatin (ZOCOR) 20 MG tablet Take 20 mg by mouth at bedtime.          Review of Systems Review of Systems  Constitutional: Negative for fever, chills and unexpected weight change.  HENT: Negative for hearing loss, congestion, sore throat, trouble swallowing and voice change.   Eyes: Negative for visual disturbance.  Respiratory: Negative for cough and wheezing.   Cardiovascular: Negative for chest pain, palpitations and leg swelling.  Gastrointestinal: Negative for nausea, vomiting, abdominal pain, diarrhea, constipation, blood in stool, abdominal distention and anal bleeding.  Genitourinary: Negative for hematuria, vaginal bleeding and difficulty urinating.  Musculoskeletal: Negative for arthralgias.  Skin: Negative for rash and wound.  Neurological: Negative for seizures, syncope and headaches.  Hematological: Negative for adenopathy. Does not bruise/bleed easily.  Psychiatric/Behavioral: Negative for confusion.    Blood pressure 138/76, pulse 72, temperature 97.8 F (36.6 C), temperature source Temporal, resp. rate 14, height 5\' 4"  (1.626 m), weight 189 lb (85.73 kg).  Physical Exam Physical Exam  Constitutional: She is oriented to person, place, and time.  She appears well-developed and well-nourished. No distress.  Eyes: Conjunctivae normal and EOM are normal. Pupils are equal, round, and reactive to light. Left eye exhibits no discharge. No scleral icterus.  Neck: Neck supple. No JVD present. No tracheal deviation present. No thyromegaly present.  Cardiovascular: Normal rate, regular rhythm, normal heart sounds and intact distal pulses.   No murmur heard. Pulmonary/Chest: Effort normal and breath sounds normal. No respiratory distress. She has no wheezes. She has no rales. She exhibits no tenderness.       Right breast has chronic,  faint erythema, no skin induration.  Well-healed incision laterally in the right breast a well-healed right axillary incision. There is no palpable mass in either breast. There is no axillary adenopathy. The port site in the left infraclavicular area looks fine.  Musculoskeletal: She exhibits no edema and no tenderness.  Lymphadenopathy:    She has no cervical adenopathy.  Neurological: She is alert and oriented to person, place, and time. She exhibits normal muscle tone. Coordination normal.  Skin: Skin is warm. No rash noted. She is not diaphoretic. No erythema. No pallor.  Psychiatric: She has a normal mood and affect. Her behavior is normal. Judgment and thought content normal.    Data Reviewed Recent mammograms. [facility] My old records.  Assessment    Invasive cancer right breast, upper outer quadrant, HER-2 positive, receptor negative, pathologic stage TI C., N1.  No evidence of recurrence 4 years following right partial mastectomy, axillary lymph node dissection, and adjuvant chemotherapy  Desires Port-A-Cath removal    Plan    Scheduled for Port-A-Cath removal. We discussed this in great detail and she would like to have this done under local anesthesia. We will schedule for this to be done in the office in the near future.  I went over the indications, details, techniques and numerous risks of this procedure with her. She understands all of this. All of her questions were answered. She agrees with this plan.       Angelia Mould. Derrell Lolling, M.D., [facility] General and Minimally invasive Surgery Breast and Colorectal Surgery Office:   [phone] Pager:   [phone]  06/17/2012, 11:11 AM

## 2012-07-01 ENCOUNTER — Telehealth (INDEPENDENT_AMBULATORY_CARE_PROVIDER_SITE_OTHER): Payer: Self-pay | Admitting: General Surgery

## 2012-07-01 NOTE — Telephone Encounter (Signed)
Called patient to advise of appointment available for port removal. Date given of 08/01/12 at 9:15. Patient advised to arrive at 9:00 and that the process should take about 30 minutes. Patient agreed.

## 2012-07-15 ENCOUNTER — Encounter (INDEPENDENT_AMBULATORY_CARE_PROVIDER_SITE_OTHER): Payer: Medicare Other | Admitting: General Surgery

## 2012-08-01 ENCOUNTER — Encounter (INDEPENDENT_AMBULATORY_CARE_PROVIDER_SITE_OTHER): Payer: Self-pay

## 2012-08-01 ENCOUNTER — Ambulatory Visit (INDEPENDENT_AMBULATORY_CARE_PROVIDER_SITE_OTHER): Payer: Medicare Other | Admitting: General Surgery

## 2012-08-01 ENCOUNTER — Encounter (INDEPENDENT_AMBULATORY_CARE_PROVIDER_SITE_OTHER): Payer: Self-pay | Admitting: General Surgery

## 2012-08-01 VITALS — BP 166/80 | HR 78 | Temp 97.9°F | Resp 20 | Ht 64.0 in | Wt 190.1 lb

## 2012-08-01 DIAGNOSIS — C50911 Malignant neoplasm of unspecified site of right female breast: Secondary | ICD-10-CM

## 2012-08-01 DIAGNOSIS — Z95828 Presence of other vascular implants and grafts: Secondary | ICD-10-CM

## 2012-08-01 DIAGNOSIS — C50919 Malignant neoplasm of unspecified site of unspecified female breast: Secondary | ICD-10-CM

## 2012-08-01 NOTE — Patient Instructions (Signed)
Your Port-A-Cath was removed without any difficulty today.  There are Steri-Strips on the wound. You may remove these if they do not fall off in 2 weeks.  You may take a shower starting tomorrow.  I recommend ice pack for 24 hours, intermittently.  Return to see Dr. Derrell Lolling if further problems arise  Continue to get annual mammograms and continue regular appointments with your medical oncologist.

## 2012-08-01 NOTE — Progress Notes (Signed)
Patient ID: Caitlin Cordova, female   DOB: Jun 14, 1941, 71 y.o.   MRN: [id number] History: This patient returns for removal of her Port-A-Cath. She is followed regularly by Dr. Donnie Coffin. She gets annual mammograms. There is no known recurrence. She has a history of right partial mastectomy,  axillary lymph node dissection on 03/20/2008 for a T1 C., N1 invasive cancer.  Procedure note: Patient taken to room 11 placed supine on operating table. Left infraclavicular area prepped and draped. 1% Xylocaine with epinephrine local anesthesia. Transverse incision through old scar. Port and catheter dissected out and removed without difficulty. Prolene sutures removed. 3-0 Vicryl subcutaneous tissue with 4-0 Monocryl for skin. Steri-Strips. Tolerated well. No complications.  Assessment: Port-A-Cath in place. Desires removal Invasive carcinoma right breast, stage TI C., N1. No evidence of recurrence 4 years following breast conservation surgery  Plan: It was her desire to continue annual  followups with Dr. Donnie Coffin and annual mammography to be reviewed by him Return to see me when necessary.  Angelia Mould. Derrell Lolling, M.D., [facility] General and Minimally invasive Surgery Breast and Colorectal Surgery Office:   [phone] Pager:   [phone]

## 2012-09-09 ENCOUNTER — Telehealth: Payer: Self-pay | Admitting: *Deleted

## 2012-09-09 NOTE — Telephone Encounter (Signed)
Called pt to r/s f/u appt.  Pt relate she will be transferring her care to [facility] since Dr. Donnie Coffin has left and she lives closer to [facility].  Informed pt to please let us know if she needs any assistance from [place].

## 2012-09-13 ENCOUNTER — Ambulatory Visit: Payer: Medicare Other | Admitting: Oncology

## 2012-09-13 ENCOUNTER — Other Ambulatory Visit: Payer: Medicare Other | Admitting: Lab

## 2013-03-12 ENCOUNTER — Other Ambulatory Visit: Payer: Self-pay | Admitting: Obstetrics and Gynecology

## 2013-03-12 DIAGNOSIS — Z853 Personal history of malignant neoplasm of breast: Secondary | ICD-10-CM

## 2013-03-12 DIAGNOSIS — Z9889 Other specified postprocedural states: Secondary | ICD-10-CM

## 2013-04-15 ENCOUNTER — Ambulatory Visit
Admission: RE | Admit: 2013-04-15 | Discharge: 2013-04-15 | Disposition: A | Discharge status: Home / Self Care | Payer: 59 | Source: Ambulatory Visit | Attending: Obstetrics and Gynecology | Admitting: Obstetrics and Gynecology

## 2013-04-15 DIAGNOSIS — Z853 Personal history of malignant neoplasm of breast: Secondary | ICD-10-CM

## 2013-04-15 DIAGNOSIS — Z9889 Other specified postprocedural states: Secondary | ICD-10-CM

## 2014-05-11 ENCOUNTER — Other Ambulatory Visit: Payer: Self-pay | Admitting: Obstetrics and Gynecology

## 2014-05-11 DIAGNOSIS — Z853 Personal history of malignant neoplasm of breast: Secondary | ICD-10-CM

## 2014-05-11 DIAGNOSIS — Z9889 Other specified postprocedural states: Secondary | ICD-10-CM

## 2014-05-15 ENCOUNTER — Other Ambulatory Visit: Payer: Self-pay | Admitting: Family Medicine

## 2014-05-15 DIAGNOSIS — M810 Age-related osteoporosis without current pathological fracture: Secondary | ICD-10-CM

## 2014-05-25 ENCOUNTER — Ambulatory Visit
Admission: RE | Admit: 2014-05-25 | Discharge: 2014-05-25 | Disposition: A | Discharge status: Home / Self Care | Payer: 59 | Source: Ambulatory Visit | Attending: Family Medicine | Admitting: Family Medicine

## 2014-05-25 ENCOUNTER — Ambulatory Visit
Admission: RE | Admit: 2014-05-25 | Discharge: 2014-05-25 | Disposition: A | Discharge status: Home / Self Care | Payer: 59 | Source: Ambulatory Visit | Attending: Obstetrics and Gynecology | Admitting: Obstetrics and Gynecology

## 2014-05-25 ENCOUNTER — Other Ambulatory Visit: Payer: Self-pay | Admitting: Obstetrics and Gynecology

## 2014-05-25 DIAGNOSIS — Z9889 Other specified postprocedural states: Secondary | ICD-10-CM

## 2014-05-25 DIAGNOSIS — M810 Age-related osteoporosis without current pathological fracture: Secondary | ICD-10-CM

## 2014-05-25 DIAGNOSIS — Z853 Personal history of malignant neoplasm of breast: Secondary | ICD-10-CM

## 2015-05-28 ENCOUNTER — Other Ambulatory Visit: Payer: Self-pay | Admitting: Family Medicine

## 2015-05-28 DIAGNOSIS — Z853 Personal history of malignant neoplasm of breast: Secondary | ICD-10-CM

## 2015-05-28 DIAGNOSIS — Z9889 Other specified postprocedural states: Secondary | ICD-10-CM

## 2016-12-12 DIAGNOSIS — D509 Iron deficiency anemia, unspecified: Secondary | ICD-10-CM | POA: Diagnosis not present

## 2018-10-16 ENCOUNTER — Other Ambulatory Visit: Payer: Self-pay | Admitting: Nurse Practitioner

## 2018-10-16 ENCOUNTER — Other Ambulatory Visit: Payer: Self-pay | Admitting: Internal Medicine

## 2018-10-16 DIAGNOSIS — R921 Mammographic calcification found on diagnostic imaging of breast: Secondary | ICD-10-CM

## 2018-10-22 ENCOUNTER — Ambulatory Visit
Admission: RE | Admit: 2018-10-22 | Discharge: 2018-10-22 | Disposition: A | Discharge status: Home / Self Care | Payer: Medicare Other | Source: Ambulatory Visit | Attending: Nurse Practitioner | Admitting: Nurse Practitioner

## 2018-10-22 ENCOUNTER — Encounter (INDEPENDENT_AMBULATORY_CARE_PROVIDER_SITE_OTHER): Payer: Self-pay

## 2018-10-22 DIAGNOSIS — R921 Mammographic calcification found on diagnostic imaging of breast: Secondary | ICD-10-CM

## 2019-09-15 DIAGNOSIS — F3341 Major depressive disorder, recurrent, in partial remission: Secondary | ICD-10-CM | POA: Diagnosis not present

## 2019-09-15 DIAGNOSIS — E1142 Type 2 diabetes mellitus with diabetic polyneuropathy: Secondary | ICD-10-CM | POA: Diagnosis not present

## 2019-09-15 DIAGNOSIS — I10 Essential (primary) hypertension: Secondary | ICD-10-CM | POA: Diagnosis not present

## 2019-09-15 DIAGNOSIS — M2550 Pain in unspecified joint: Secondary | ICD-10-CM | POA: Diagnosis not present

## 2019-09-15 DIAGNOSIS — E785 Hyperlipidemia, unspecified: Secondary | ICD-10-CM | POA: Diagnosis not present

## 2019-09-24 DIAGNOSIS — I10 Essential (primary) hypertension: Secondary | ICD-10-CM | POA: Diagnosis not present

## 2019-09-24 DIAGNOSIS — E785 Hyperlipidemia, unspecified: Secondary | ICD-10-CM | POA: Diagnosis not present

## 2019-09-24 DIAGNOSIS — E1142 Type 2 diabetes mellitus with diabetic polyneuropathy: Secondary | ICD-10-CM | POA: Diagnosis not present

## 2019-09-24 DIAGNOSIS — M2550 Pain in unspecified joint: Secondary | ICD-10-CM | POA: Diagnosis not present

## 2019-11-21 DIAGNOSIS — M25512 Pain in left shoulder: Secondary | ICD-10-CM | POA: Diagnosis not present

## 2019-11-21 DIAGNOSIS — E785 Hyperlipidemia, unspecified: Secondary | ICD-10-CM | POA: Diagnosis not present

## 2019-11-21 DIAGNOSIS — E1142 Type 2 diabetes mellitus with diabetic polyneuropathy: Secondary | ICD-10-CM | POA: Diagnosis not present

## 2019-11-21 DIAGNOSIS — F331 Major depressive disorder, recurrent, moderate: Secondary | ICD-10-CM | POA: Diagnosis not present

## 2019-11-21 DIAGNOSIS — M25511 Pain in right shoulder: Secondary | ICD-10-CM | POA: Diagnosis not present

## 2019-11-21 DIAGNOSIS — I10 Essential (primary) hypertension: Secondary | ICD-10-CM | POA: Diagnosis not present

## 2020-02-13 DIAGNOSIS — Z1231 Encounter for screening mammogram for malignant neoplasm of breast: Secondary | ICD-10-CM | POA: Diagnosis not present

## 2020-06-02 DIAGNOSIS — E785 Hyperlipidemia, unspecified: Secondary | ICD-10-CM | POA: Diagnosis not present

## 2020-06-02 DIAGNOSIS — Z0000 Encounter for general adult medical examination without abnormal findings: Secondary | ICD-10-CM | POA: Diagnosis not present

## 2020-06-02 DIAGNOSIS — Z9181 History of falling: Secondary | ICD-10-CM | POA: Diagnosis not present

## 2020-06-02 DIAGNOSIS — Z1331 Encounter for screening for depression: Secondary | ICD-10-CM | POA: Diagnosis not present

## 2020-07-01 DIAGNOSIS — I10 Essential (primary) hypertension: Secondary | ICD-10-CM | POA: Diagnosis not present

## 2020-07-01 DIAGNOSIS — L989 Disorder of the skin and subcutaneous tissue, unspecified: Secondary | ICD-10-CM | POA: Diagnosis not present

## 2020-07-01 DIAGNOSIS — E785 Hyperlipidemia, unspecified: Secondary | ICD-10-CM | POA: Diagnosis not present

## 2020-07-01 DIAGNOSIS — M25512 Pain in left shoulder: Secondary | ICD-10-CM | POA: Diagnosis not present

## 2020-07-01 DIAGNOSIS — M25511 Pain in right shoulder: Secondary | ICD-10-CM | POA: Diagnosis not present

## 2020-07-01 DIAGNOSIS — E1142 Type 2 diabetes mellitus with diabetic polyneuropathy: Secondary | ICD-10-CM | POA: Diagnosis not present

## 2020-07-01 DIAGNOSIS — F3341 Major depressive disorder, recurrent, in partial remission: Secondary | ICD-10-CM | POA: Diagnosis not present

## 2020-10-29 DIAGNOSIS — K219 Gastro-esophageal reflux disease without esophagitis: Secondary | ICD-10-CM | POA: Diagnosis not present

## 2020-10-29 DIAGNOSIS — E1142 Type 2 diabetes mellitus with diabetic polyneuropathy: Secondary | ICD-10-CM | POA: Diagnosis not present

## 2020-10-29 DIAGNOSIS — F3341 Major depressive disorder, recurrent, in partial remission: Secondary | ICD-10-CM | POA: Diagnosis not present

## 2020-10-29 DIAGNOSIS — M25511 Pain in right shoulder: Secondary | ICD-10-CM | POA: Diagnosis not present

## 2020-10-29 DIAGNOSIS — Z6827 Body mass index (BMI) 27.0-27.9, adult: Secondary | ICD-10-CM | POA: Diagnosis not present

## 2020-10-29 DIAGNOSIS — E785 Hyperlipidemia, unspecified: Secondary | ICD-10-CM | POA: Diagnosis not present

## 2020-10-29 DIAGNOSIS — M25512 Pain in left shoulder: Secondary | ICD-10-CM | POA: Diagnosis not present

## 2020-10-29 DIAGNOSIS — I10 Essential (primary) hypertension: Secondary | ICD-10-CM | POA: Diagnosis not present

## 2020-11-02 DIAGNOSIS — M25511 Pain in right shoulder: Secondary | ICD-10-CM | POA: Diagnosis not present

## 2020-11-02 DIAGNOSIS — M25512 Pain in left shoulder: Secondary | ICD-10-CM | POA: Diagnosis not present

## 2020-11-02 DIAGNOSIS — M19011 Primary osteoarthritis, right shoulder: Secondary | ICD-10-CM | POA: Diagnosis not present

## 2020-11-02 DIAGNOSIS — J8410 Pulmonary fibrosis, unspecified: Secondary | ICD-10-CM | POA: Diagnosis not present

## 2020-11-02 DIAGNOSIS — M19012 Primary osteoarthritis, left shoulder: Secondary | ICD-10-CM | POA: Diagnosis not present

## 2021-02-28 DIAGNOSIS — E785 Hyperlipidemia, unspecified: Secondary | ICD-10-CM | POA: Diagnosis not present

## 2021-02-28 DIAGNOSIS — M25511 Pain in right shoulder: Secondary | ICD-10-CM | POA: Diagnosis not present

## 2021-02-28 DIAGNOSIS — K219 Gastro-esophageal reflux disease without esophagitis: Secondary | ICD-10-CM | POA: Diagnosis not present

## 2021-02-28 DIAGNOSIS — E1142 Type 2 diabetes mellitus with diabetic polyneuropathy: Secondary | ICD-10-CM | POA: Diagnosis not present

## 2021-02-28 DIAGNOSIS — Z6827 Body mass index (BMI) 27.0-27.9, adult: Secondary | ICD-10-CM | POA: Diagnosis not present

## 2021-02-28 DIAGNOSIS — M25512 Pain in left shoulder: Secondary | ICD-10-CM | POA: Diagnosis not present

## 2021-02-28 DIAGNOSIS — Z139 Encounter for screening, unspecified: Secondary | ICD-10-CM | POA: Diagnosis not present

## 2021-02-28 DIAGNOSIS — I10 Essential (primary) hypertension: Secondary | ICD-10-CM | POA: Diagnosis not present

## 2021-02-28 DIAGNOSIS — F3341 Major depressive disorder, recurrent, in partial remission: Secondary | ICD-10-CM | POA: Diagnosis not present

## 2021-04-06 DIAGNOSIS — F419 Anxiety disorder, unspecified: Secondary | ICD-10-CM | POA: Diagnosis not present

## 2021-04-06 DIAGNOSIS — F3341 Major depressive disorder, recurrent, in partial remission: Secondary | ICD-10-CM | POA: Diagnosis not present

## 2021-06-06 DIAGNOSIS — Z9181 History of falling: Secondary | ICD-10-CM | POA: Diagnosis not present

## 2021-06-06 DIAGNOSIS — Z1331 Encounter for screening for depression: Secondary | ICD-10-CM | POA: Diagnosis not present

## 2021-06-06 DIAGNOSIS — E785 Hyperlipidemia, unspecified: Secondary | ICD-10-CM | POA: Diagnosis not present

## 2021-06-06 DIAGNOSIS — Z0000 Encounter for general adult medical examination without abnormal findings: Secondary | ICD-10-CM | POA: Diagnosis not present

## 2021-08-16 DIAGNOSIS — I10 Essential (primary) hypertension: Secondary | ICD-10-CM | POA: Diagnosis not present

## 2021-08-16 DIAGNOSIS — E1142 Type 2 diabetes mellitus with diabetic polyneuropathy: Secondary | ICD-10-CM | POA: Diagnosis not present

## 2021-08-16 DIAGNOSIS — M25512 Pain in left shoulder: Secondary | ICD-10-CM | POA: Diagnosis not present

## 2021-08-16 DIAGNOSIS — E785 Hyperlipidemia, unspecified: Secondary | ICD-10-CM | POA: Diagnosis not present

## 2021-08-16 DIAGNOSIS — F3341 Major depressive disorder, recurrent, in partial remission: Secondary | ICD-10-CM | POA: Diagnosis not present

## 2021-08-16 DIAGNOSIS — K219 Gastro-esophageal reflux disease without esophagitis: Secondary | ICD-10-CM | POA: Diagnosis not present

## 2021-08-16 DIAGNOSIS — Z6825 Body mass index (BMI) 25.0-25.9, adult: Secondary | ICD-10-CM | POA: Diagnosis not present

## 2021-11-15 DIAGNOSIS — F3341 Major depressive disorder, recurrent, in partial remission: Secondary | ICD-10-CM | POA: Diagnosis not present

## 2021-11-15 DIAGNOSIS — Z6825 Body mass index (BMI) 25.0-25.9, adult: Secondary | ICD-10-CM | POA: Diagnosis not present

## 2021-11-15 DIAGNOSIS — K219 Gastro-esophageal reflux disease without esophagitis: Secondary | ICD-10-CM | POA: Diagnosis not present

## 2021-11-15 DIAGNOSIS — E1142 Type 2 diabetes mellitus with diabetic polyneuropathy: Secondary | ICD-10-CM | POA: Diagnosis not present

## 2021-11-15 DIAGNOSIS — E785 Hyperlipidemia, unspecified: Secondary | ICD-10-CM | POA: Diagnosis not present

## 2021-11-15 DIAGNOSIS — I10 Essential (primary) hypertension: Secondary | ICD-10-CM | POA: Diagnosis not present

## 2021-11-15 DIAGNOSIS — M25512 Pain in left shoulder: Secondary | ICD-10-CM | POA: Diagnosis not present

## 2022-03-23 DIAGNOSIS — K219 Gastro-esophageal reflux disease without esophagitis: Secondary | ICD-10-CM | POA: Diagnosis not present

## 2022-03-23 DIAGNOSIS — E785 Hyperlipidemia, unspecified: Secondary | ICD-10-CM | POA: Diagnosis not present

## 2022-03-23 DIAGNOSIS — M25512 Pain in left shoulder: Secondary | ICD-10-CM | POA: Diagnosis not present

## 2022-03-23 DIAGNOSIS — I10 Essential (primary) hypertension: Secondary | ICD-10-CM | POA: Diagnosis not present

## 2022-03-23 DIAGNOSIS — F3341 Major depressive disorder, recurrent, in partial remission: Secondary | ICD-10-CM | POA: Diagnosis not present

## 2022-03-23 DIAGNOSIS — E1142 Type 2 diabetes mellitus with diabetic polyneuropathy: Secondary | ICD-10-CM | POA: Diagnosis not present

## 2022-08-25 DIAGNOSIS — E785 Hyperlipidemia, unspecified: Secondary | ICD-10-CM | POA: Diagnosis not present

## 2022-08-25 DIAGNOSIS — K219 Gastro-esophageal reflux disease without esophagitis: Secondary | ICD-10-CM | POA: Diagnosis not present

## 2022-08-25 DIAGNOSIS — R399 Unspecified symptoms and signs involving the genitourinary system: Secondary | ICD-10-CM | POA: Diagnosis not present

## 2022-08-25 DIAGNOSIS — Z23 Encounter for immunization: Secondary | ICD-10-CM | POA: Diagnosis not present

## 2022-08-25 DIAGNOSIS — I10 Essential (primary) hypertension: Secondary | ICD-10-CM | POA: Diagnosis not present

## 2022-08-25 DIAGNOSIS — F3341 Major depressive disorder, recurrent, in partial remission: Secondary | ICD-10-CM | POA: Diagnosis not present

## 2022-08-25 DIAGNOSIS — E559 Vitamin D deficiency, unspecified: Secondary | ICD-10-CM | POA: Diagnosis not present

## 2022-08-25 DIAGNOSIS — E1142 Type 2 diabetes mellitus with diabetic polyneuropathy: Secondary | ICD-10-CM | POA: Diagnosis not present

## 2022-12-01 DIAGNOSIS — K219 Gastro-esophageal reflux disease without esophagitis: Secondary | ICD-10-CM | POA: Diagnosis not present

## 2022-12-01 DIAGNOSIS — E785 Hyperlipidemia, unspecified: Secondary | ICD-10-CM | POA: Diagnosis not present

## 2022-12-01 DIAGNOSIS — R42 Dizziness and giddiness: Secondary | ICD-10-CM | POA: Diagnosis not present

## 2022-12-01 DIAGNOSIS — E1142 Type 2 diabetes mellitus with diabetic polyneuropathy: Secondary | ICD-10-CM | POA: Diagnosis not present

## 2022-12-01 DIAGNOSIS — E559 Vitamin D deficiency, unspecified: Secondary | ICD-10-CM | POA: Diagnosis not present

## 2022-12-01 DIAGNOSIS — E538 Deficiency of other specified B group vitamins: Secondary | ICD-10-CM | POA: Diagnosis not present

## 2022-12-01 DIAGNOSIS — F419 Anxiety disorder, unspecified: Secondary | ICD-10-CM | POA: Diagnosis not present

## 2022-12-01 DIAGNOSIS — I10 Essential (primary) hypertension: Secondary | ICD-10-CM | POA: Diagnosis not present

## 2023-01-12 DIAGNOSIS — I10 Essential (primary) hypertension: Secondary | ICD-10-CM | POA: Diagnosis not present

## 2023-01-12 DIAGNOSIS — Z9181 History of falling: Secondary | ICD-10-CM | POA: Diagnosis not present

## 2023-01-12 DIAGNOSIS — Z139 Encounter for screening, unspecified: Secondary | ICD-10-CM | POA: Diagnosis not present

## 2023-01-12 DIAGNOSIS — F419 Anxiety disorder, unspecified: Secondary | ICD-10-CM | POA: Diagnosis not present

## 2023-05-18 DIAGNOSIS — F419 Anxiety disorder, unspecified: Secondary | ICD-10-CM | POA: Diagnosis not present

## 2023-05-18 DIAGNOSIS — I10 Essential (primary) hypertension: Secondary | ICD-10-CM | POA: Diagnosis not present

## 2023-05-18 DIAGNOSIS — R300 Dysuria: Secondary | ICD-10-CM | POA: Diagnosis not present

## 2023-05-18 DIAGNOSIS — Z23 Encounter for immunization: Secondary | ICD-10-CM | POA: Diagnosis not present

## 2023-05-18 DIAGNOSIS — E1142 Type 2 diabetes mellitus with diabetic polyneuropathy: Secondary | ICD-10-CM | POA: Diagnosis not present

## 2023-05-18 DIAGNOSIS — K219 Gastro-esophageal reflux disease without esophagitis: Secondary | ICD-10-CM | POA: Diagnosis not present

## 2023-05-18 DIAGNOSIS — R600 Localized edema: Secondary | ICD-10-CM | POA: Diagnosis not present

## 2023-05-18 DIAGNOSIS — E785 Hyperlipidemia, unspecified: Secondary | ICD-10-CM | POA: Diagnosis not present

## 2023-05-18 DIAGNOSIS — F3341 Major depressive disorder, recurrent, in partial remission: Secondary | ICD-10-CM | POA: Diagnosis not present

## 2023-06-12 ENCOUNTER — Ambulatory Visit: Payer: Medicare PPO | Admitting: Podiatry

## 2023-06-19 ENCOUNTER — Ambulatory Visit: Payer: Medicare PPO | Admitting: Podiatry

## 2023-06-19 DIAGNOSIS — L600 Ingrowing nail: Secondary | ICD-10-CM

## 2023-06-19 DIAGNOSIS — L603 Nail dystrophy: Secondary | ICD-10-CM | POA: Diagnosis not present

## 2023-06-19 NOTE — Progress Notes (Signed)
  Subjective:  Patient ID: [facility], female    DOB: 08/25/1940,  MRN: [id number]   82 y.o. female presents with concern for ingrown dystrophic thickened nail on the right great toe with some redness at the base.  She has previously been on antibiotics for UTI which seemed to help a little bit but still has some redness at the base of the nail.  Nail is very thick and elongated dystrophic has been growing abnormally for months to years timeframe.  Having difficulty trimming her own nails.  Past Medical History:  Diagnosis Date   Arthritis    Cancer (HCC) 2009   breast   GERD (gastroesophageal reflux disease)    Hyperlipidemia    Hypertension    Malayan filarial lymphodoema     Allergies  Allergen Reactions   Ivp Dye [Iodinated Contrast Media] Rash    ROS: Negative except as per HPI above  Objective:  General: AAO x3, NAD  Dermatological: Severe thick dystrophy of the right hallux nail with pain on palpation of the nail due to thickness there is erythema at the base of the nailbed.  No active purulent drainage.  Vascular:  Dorsalis Pedis artery and Posterior Tibial artery pedal pulses are 2/4 bilateral.  Capillary fill time < 3 sec to all digits.   Neruologic: Grossly intact via light touch bilateral. Protective threshold intact to all sites bilateral.   Musculoskeletal: No gross boney pedal deformities bilateral. No pain, crepitus, or limitation noted with foot and ankle range of motion bilateral. Muscular strength 5/5 in all groups tested bilateral.  Gait: Unassisted, Nonantalgic.   No images are attached to the encounter.   Assessment:   1. Ingrown nail of great toe of right foot   2. Nail dystrophy      Plan:  Patient was evaluated and treated and all questions answered.    Ingrown Nail, right -Patient elects to proceed with minor surgery to remove ingrown toenail today. Consent reviewed and signed by patient. -Ingrown nail excised. See procedure  note. -Educated on post-procedure care including soaking. Written instructions provided and reviewed. -Patient to follow up in 2 weeks for nail check.  Procedure: Excision of Ingrown Toenail Location: Right 1st toe total nail due to dystrophy Anesthesia: Lidocaine 1% plain; 1.5 mL and Marcaine 0.5% plain; 1.5 mL, digital block. Skin Prep: Betadine. Dressing: Silvadene; telfa; dry, sterile, compression dressing. Technique: Following skin prep, the toe was exsanguinated and a tourniquet was secured at the base of the toe. The affected nail border was freed, split with a nail splitter, and excised. Chemical matrixectomy was then performed with NaOH and irrigated out with alcohol (if phenol) or vinegar (if NaOH). The tourniquet was then removed and sterile dressing applied. Disposition: Patient tolerated procedure well. Patient to return in 2 weeks for follow-up.    Return in about 2 weeks (around 07/03/2023) for nail check room.          Corinna Gab, DPM [facility] / [facility]

## 2023-06-19 NOTE — Patient Instructions (Signed)

## 2023-07-03 ENCOUNTER — Ambulatory Visit: Payer: Medicare PPO | Admitting: Podiatry

## 2023-07-03 DIAGNOSIS — B351 Tinea unguium: Secondary | ICD-10-CM

## 2023-07-03 DIAGNOSIS — L603 Nail dystrophy: Secondary | ICD-10-CM | POA: Diagnosis not present

## 2023-07-03 DIAGNOSIS — I739 Peripheral vascular disease, unspecified: Secondary | ICD-10-CM | POA: Diagnosis not present

## 2023-07-03 DIAGNOSIS — L84 Corns and callosities: Secondary | ICD-10-CM

## 2023-07-03 DIAGNOSIS — M79676 Pain in unspecified toe(s): Secondary | ICD-10-CM | POA: Diagnosis not present

## 2023-07-03 DIAGNOSIS — L600 Ingrowing nail: Secondary | ICD-10-CM | POA: Diagnosis not present

## 2023-07-03 NOTE — Progress Notes (Signed)
Subjective: [facility] is a 83 y.o. female returns to office today for follow up evaluation after having right Hallux total nail ingrown removal with phenol and alcohol matrixectomy approximately 2 weeks ago. Patient has been soaking using epsom salts and applying topical antibiotic covered with bandaid daily. Patient denies fevers, chills, nausea, vomiting. Denies any calf pain, chest pain, SOB.  Patient also coming in for callus present on the right foot plantar first MPJ area.  Objective:  Vitals: Reviewed  General: Well developed, nourished, in no acute distress, alert and oriented x3   Dermatology: Skin is warm, dry and supple bilateral. right hallux nail bed appears to be clean, dry, with mild granular tissue and surrounding scab. There is no surrounding erythema, edema, drainage/purulence. The remaining nails appear unremarkable at this time. There are no other lesions or other signs of infection present.  Neurovascular status: Intact. No lower extremity swelling; No pain with calf compression bilateral.  Musculoskeletal: Decreased tenderness to palpation of the right hallux nail fold(s). Muscular strength within normal limits bilateral.   Assesement and Plan: S/p phenol and alcohol matrixectomy to the  right hallux nail total, doing well.   -Continue soaking in epsom salts twice a day followed by antibiotic ointment and a band-aid. Can leave uncovered at night. Continue this until completely healed.  -If the area has not healed in 2 weeks, call the office for follow-up appointment, or sooner if any problems arise.  -Monitor for any signs/symptoms of infection. Call the office immediately if any occur or go directly to the emergency room. Call with any questions/concerns.  # Callus subfirst MPJ right foot All symptomatic hyperkeratoses x 1 separate lesions were safely debrided with a sterile #10 blade to patient's level of comfort without incident. We discussed preventative and  palliative care of these lesions including supportive and accommodative shoegear, padding, prefabricated and custom molded accommodative orthoses, use of a pumice stone and lotions/creams daily.  #Onychomycosis with pain  -Nails palliatively debrided as below. -Educated on self-care  Procedure: Nail Debridement Rationale: Pain Type of Debridement: manual, sharp debridement. Instrumentation: Nail nipper, rotary burr. Number of Nails: 10        Corinna Gab, DPM [facility] / [facility]                   07/03/2023

## 2023-07-24 DIAGNOSIS — F3341 Major depressive disorder, recurrent, in partial remission: Secondary | ICD-10-CM | POA: Diagnosis not present

## 2023-07-24 DIAGNOSIS — R600 Localized edema: Secondary | ICD-10-CM | POA: Diagnosis not present

## 2023-07-24 DIAGNOSIS — F419 Anxiety disorder, unspecified: Secondary | ICD-10-CM | POA: Diagnosis not present

## 2023-07-24 DIAGNOSIS — R399 Unspecified symptoms and signs involving the genitourinary system: Secondary | ICD-10-CM | POA: Diagnosis not present

## 2023-10-08 ENCOUNTER — Ambulatory Visit: Payer: Medicare PPO | Admitting: Podiatry

## 2023-10-23 DIAGNOSIS — K219 Gastro-esophageal reflux disease without esophagitis: Secondary | ICD-10-CM | POA: Diagnosis not present

## 2023-10-23 DIAGNOSIS — E1142 Type 2 diabetes mellitus with diabetic polyneuropathy: Secondary | ICD-10-CM | POA: Diagnosis not present

## 2023-10-23 DIAGNOSIS — R300 Dysuria: Secondary | ICD-10-CM | POA: Diagnosis not present

## 2023-10-23 DIAGNOSIS — F3341 Major depressive disorder, recurrent, in partial remission: Secondary | ICD-10-CM | POA: Diagnosis not present

## 2023-10-23 DIAGNOSIS — Z0000 Encounter for general adult medical examination without abnormal findings: Secondary | ICD-10-CM | POA: Diagnosis not present

## 2023-10-23 DIAGNOSIS — I10 Essential (primary) hypertension: Secondary | ICD-10-CM | POA: Diagnosis not present

## 2023-10-23 DIAGNOSIS — Z9181 History of falling: Secondary | ICD-10-CM | POA: Diagnosis not present

## 2023-10-23 DIAGNOSIS — E785 Hyperlipidemia, unspecified: Secondary | ICD-10-CM | POA: Diagnosis not present

## 2023-10-23 DIAGNOSIS — E559 Vitamin D deficiency, unspecified: Secondary | ICD-10-CM | POA: Diagnosis not present

## 2023-11-09 DIAGNOSIS — R197 Diarrhea, unspecified: Secondary | ICD-10-CM | POA: Diagnosis not present

## 2024-05-27 DIAGNOSIS — E1142 Type 2 diabetes mellitus with diabetic polyneuropathy: Secondary | ICD-10-CM | POA: Diagnosis not present

## 2024-05-27 DIAGNOSIS — R600 Localized edema: Secondary | ICD-10-CM | POA: Diagnosis not present

## 2024-05-27 DIAGNOSIS — F419 Anxiety disorder, unspecified: Secondary | ICD-10-CM | POA: Diagnosis not present

## 2024-05-27 DIAGNOSIS — F3341 Major depressive disorder, recurrent, in partial remission: Secondary | ICD-10-CM | POA: Diagnosis not present

## 2024-05-27 DIAGNOSIS — I10 Essential (primary) hypertension: Secondary | ICD-10-CM | POA: Diagnosis not present

## 2024-05-27 DIAGNOSIS — E785 Hyperlipidemia, unspecified: Secondary | ICD-10-CM | POA: Diagnosis not present

## 2024-05-27 DIAGNOSIS — K219 Gastro-esophageal reflux disease without esophagitis: Secondary | ICD-10-CM | POA: Diagnosis not present

## 2024-05-27 DIAGNOSIS — E559 Vitamin D deficiency, unspecified: Secondary | ICD-10-CM | POA: Diagnosis not present
